# Patient Record
Sex: Female | Born: 1965 | Race: White | Hispanic: No | Marital: Single | Smoking: Current every day smoker
Health system: Southern US, Community
[De-identification: ages and names within clinical notes are randomized; demographics above are authoritative.]

## PROBLEM LIST (undated history)

## (undated) DIAGNOSIS — I809 Phlebitis and thrombophlebitis of unspecified site: Secondary | ICD-10-CM

## (undated) DIAGNOSIS — G8929 Other chronic pain: Secondary | ICD-10-CM

## (undated) DIAGNOSIS — R1012 Left upper quadrant pain: Secondary | ICD-10-CM

## (undated) DIAGNOSIS — O4590 Premature separation of placenta, unspecified, unspecified trimester: Secondary | ICD-10-CM

## (undated) DIAGNOSIS — K589 Irritable bowel syndrome without diarrhea: Secondary | ICD-10-CM

## (undated) DIAGNOSIS — M1990 Unspecified osteoarthritis, unspecified site: Secondary | ICD-10-CM

## (undated) DIAGNOSIS — L0591 Pilonidal cyst without abscess: Secondary | ICD-10-CM

## (undated) DIAGNOSIS — F32A Depression, unspecified: Secondary | ICD-10-CM

## (undated) DIAGNOSIS — F329 Major depressive disorder, single episode, unspecified: Secondary | ICD-10-CM

## (undated) DIAGNOSIS — M542 Cervicalgia: Secondary | ICD-10-CM

## (undated) DIAGNOSIS — E785 Hyperlipidemia, unspecified: Secondary | ICD-10-CM

## (undated) DIAGNOSIS — J189 Pneumonia, unspecified organism: Secondary | ICD-10-CM

## (undated) DIAGNOSIS — R21 Rash and other nonspecific skin eruption: Secondary | ICD-10-CM

## (undated) DIAGNOSIS — M549 Dorsalgia, unspecified: Secondary | ICD-10-CM

## (undated) DIAGNOSIS — N200 Calculus of kidney: Secondary | ICD-10-CM

## (undated) DIAGNOSIS — N281 Cyst of kidney, acquired: Secondary | ICD-10-CM

## (undated) DIAGNOSIS — Z8614 Personal history of Methicillin resistant Staphylococcus aureus infection: Secondary | ICD-10-CM

## (undated) DIAGNOSIS — K219 Gastro-esophageal reflux disease without esophagitis: Secondary | ICD-10-CM

## (undated) DIAGNOSIS — F419 Anxiety disorder, unspecified: Secondary | ICD-10-CM

## (undated) DIAGNOSIS — N809 Endometriosis, unspecified: Secondary | ICD-10-CM

## (undated) DIAGNOSIS — C539 Malignant neoplasm of cervix uteri, unspecified: Secondary | ICD-10-CM

## (undated) HISTORY — PX: ABDOMINAL HYSTERECTOMY: SHX81

## (undated) HISTORY — DX: Pilonidal cyst without abscess: L05.91

## (undated) HISTORY — DX: Phlebitis and thrombophlebitis of unspecified site: I80.9

## (undated) HISTORY — DX: Cyst of kidney, acquired: N28.1

## (undated) HISTORY — DX: Rash and other nonspecific skin eruption: R21

## (undated) HISTORY — PX: COLONOSCOPY: SHX174

## (undated) HISTORY — PX: BACK SURGERY: SHX140

## (undated) HISTORY — PX: TONSILLECTOMY: SUR1361

## (undated) HISTORY — DX: Premature separation of placenta, unspecified, unspecified trimester: O45.90

## (undated) HISTORY — DX: Gastro-esophageal reflux disease without esophagitis: K21.9

## (undated) HISTORY — DX: Endometriosis, unspecified: N80.9

## (undated) HISTORY — DX: Malignant neoplasm of cervix uteri, unspecified: C53.9

## (undated) HISTORY — DX: Calculus of kidney: N20.0

## (undated) HISTORY — DX: Hyperlipidemia, unspecified: E78.5

## (undated) HISTORY — DX: Left upper quadrant pain: R10.12

---

## 1987-09-29 DIAGNOSIS — C539 Malignant neoplasm of cervix uteri, unspecified: Secondary | ICD-10-CM

## 1987-09-29 HISTORY — DX: Malignant neoplasm of cervix uteri, unspecified: C53.9

## 2011-09-28 ENCOUNTER — Other Ambulatory Visit (HOSPITAL_COMMUNITY): Payer: Self-pay | Admitting: Family Medicine

## 2011-09-28 DIAGNOSIS — Z139 Encounter for screening, unspecified: Secondary | ICD-10-CM

## 2011-10-05 ENCOUNTER — Ambulatory Visit (HOSPITAL_COMMUNITY)
Admission: RE | Admit: 2011-10-05 | Discharge: 2011-10-05 | Disposition: A | Discharge status: Home / Self Care | Payer: BC Managed Care – PPO | Source: Ambulatory Visit | Attending: Family Medicine | Admitting: Family Medicine

## 2011-10-05 DIAGNOSIS — Z1231 Encounter for screening mammogram for malignant neoplasm of breast: Secondary | ICD-10-CM | POA: Insufficient documentation

## 2011-10-05 DIAGNOSIS — Z139 Encounter for screening, unspecified: Secondary | ICD-10-CM

## 2012-03-14 ENCOUNTER — Ambulatory Visit (HOSPITAL_COMMUNITY)
Admission: RE | Admit: 2012-03-14 | Discharge: 2012-03-14 | Disposition: A | Discharge status: Home / Self Care | Payer: BC Managed Care – PPO | Source: Ambulatory Visit | Attending: Internal Medicine | Admitting: Internal Medicine

## 2012-03-14 ENCOUNTER — Other Ambulatory Visit (HOSPITAL_COMMUNITY): Payer: Self-pay | Admitting: Internal Medicine

## 2012-03-14 DIAGNOSIS — Z01811 Encounter for preprocedural respiratory examination: Secondary | ICD-10-CM

## 2012-03-14 DIAGNOSIS — R059 Cough, unspecified: Secondary | ICD-10-CM | POA: Insufficient documentation

## 2012-03-14 DIAGNOSIS — Z01818 Encounter for other preprocedural examination: Secondary | ICD-10-CM | POA: Insufficient documentation

## 2012-03-14 DIAGNOSIS — R079 Chest pain, unspecified: Secondary | ICD-10-CM | POA: Insufficient documentation

## 2012-03-14 DIAGNOSIS — R05 Cough: Secondary | ICD-10-CM | POA: Insufficient documentation

## 2012-07-18 ENCOUNTER — Other Ambulatory Visit (HOSPITAL_COMMUNITY): Payer: Self-pay | Admitting: Internal Medicine

## 2012-07-18 DIAGNOSIS — M549 Dorsalgia, unspecified: Secondary | ICD-10-CM

## 2012-07-18 DIAGNOSIS — M545 Low back pain, unspecified: Secondary | ICD-10-CM

## 2012-07-20 ENCOUNTER — Ambulatory Visit (HOSPITAL_COMMUNITY): Payer: BC Managed Care – PPO

## 2012-08-02 ENCOUNTER — Ambulatory Visit (HOSPITAL_COMMUNITY)
Admission: RE | Admit: 2012-08-02 | Discharge: 2012-08-02 | Disposition: A | Discharge status: Home / Self Care | Payer: BC Managed Care – PPO | Source: Ambulatory Visit | Attending: Internal Medicine | Admitting: Internal Medicine

## 2012-08-02 DIAGNOSIS — M545 Low back pain, unspecified: Secondary | ICD-10-CM | POA: Insufficient documentation

## 2012-08-02 DIAGNOSIS — M5137 Other intervertebral disc degeneration, lumbosacral region: Secondary | ICD-10-CM | POA: Insufficient documentation

## 2012-08-02 DIAGNOSIS — M5126 Other intervertebral disc displacement, lumbar region: Secondary | ICD-10-CM | POA: Insufficient documentation

## 2012-08-02 DIAGNOSIS — M549 Dorsalgia, unspecified: Secondary | ICD-10-CM

## 2012-08-02 DIAGNOSIS — M51379 Other intervertebral disc degeneration, lumbosacral region without mention of lumbar back pain or lower extremity pain: Secondary | ICD-10-CM | POA: Insufficient documentation

## 2012-09-28 DIAGNOSIS — N200 Calculus of kidney: Secondary | ICD-10-CM

## 2012-09-28 DIAGNOSIS — N281 Cyst of kidney, acquired: Secondary | ICD-10-CM

## 2012-09-28 DIAGNOSIS — Z8614 Personal history of Methicillin resistant Staphylococcus aureus infection: Secondary | ICD-10-CM

## 2012-09-28 HISTORY — DX: Personal history of Methicillin resistant Staphylococcus aureus infection: Z86.14

## 2012-09-28 HISTORY — DX: Cyst of kidney, acquired: N28.1

## 2012-09-28 HISTORY — DX: Calculus of kidney: N20.0

## 2012-09-28 HISTORY — PX: COCCYX REMOVAL: SHX600

## 2013-09-28 DIAGNOSIS — L0591 Pilonidal cyst without abscess: Secondary | ICD-10-CM

## 2013-09-28 HISTORY — DX: Pilonidal cyst without abscess: L05.91

## 2014-11-15 ENCOUNTER — Encounter (HOSPITAL_COMMUNITY): Payer: Self-pay

## 2014-11-15 ENCOUNTER — Emergency Department (HOSPITAL_COMMUNITY)
Admission: EM | Admit: 2014-11-15 | Discharge: 2014-11-15 | Disposition: A | Discharge status: Home / Self Care | Payer: Self-pay | Attending: Emergency Medicine | Admitting: Emergency Medicine

## 2014-11-15 DIAGNOSIS — M545 Low back pain: Secondary | ICD-10-CM | POA: Insufficient documentation

## 2014-11-15 DIAGNOSIS — Z720 Tobacco use: Secondary | ICD-10-CM | POA: Insufficient documentation

## 2014-11-15 DIAGNOSIS — G8918 Other acute postprocedural pain: Secondary | ICD-10-CM | POA: Insufficient documentation

## 2014-11-15 DIAGNOSIS — G8929 Other chronic pain: Secondary | ICD-10-CM | POA: Insufficient documentation

## 2014-11-15 DIAGNOSIS — K5900 Constipation, unspecified: Secondary | ICD-10-CM | POA: Insufficient documentation

## 2014-11-15 DIAGNOSIS — Z8614 Personal history of Methicillin resistant Staphylococcus aureus infection: Secondary | ICD-10-CM | POA: Insufficient documentation

## 2014-11-15 DIAGNOSIS — Z3202 Encounter for pregnancy test, result negative: Secondary | ICD-10-CM | POA: Insufficient documentation

## 2014-11-15 DIAGNOSIS — Z9889 Other specified postprocedural states: Secondary | ICD-10-CM | POA: Insufficient documentation

## 2014-11-15 HISTORY — DX: Irritable bowel syndrome, unspecified: K58.9

## 2014-11-15 HISTORY — DX: Dorsalgia, unspecified: M54.9

## 2014-11-15 HISTORY — DX: Other chronic pain: G89.29

## 2014-11-15 LAB — CBC WITH DIFFERENTIAL/PLATELET
Basophils Absolute: 0.1 10*3/uL (ref 0.0–0.1)
Basophils Relative: 1 % (ref 0–1)
Eosinophils Absolute: 0.1 10*3/uL (ref 0.0–0.7)
Eosinophils Relative: 1 % (ref 0–5)
HCT: 42.7 % (ref 36.0–46.0)
HEMOGLOBIN: 13.9 g/dL (ref 12.0–15.0)
Lymphocytes Relative: 35 % (ref 12–46)
Lymphs Abs: 3.3 10*3/uL (ref 0.7–4.0)
MCH: 30.2 pg (ref 26.0–34.0)
MCHC: 32.6 g/dL (ref 30.0–36.0)
MCV: 92.6 fL (ref 78.0–100.0)
Monocytes Absolute: 0.4 10*3/uL (ref 0.1–1.0)
Monocytes Relative: 4 % (ref 3–12)
Neutro Abs: 5.5 10*3/uL (ref 1.7–7.7)
Neutrophils Relative %: 59 % (ref 43–77)
Platelets: 252 10*3/uL (ref 150–400)
RBC: 4.61 MIL/uL (ref 3.87–5.11)
RDW: 12.4 % (ref 11.5–15.5)
WBC: 9.3 10*3/uL (ref 4.0–10.5)

## 2014-11-15 LAB — URINALYSIS, ROUTINE W REFLEX MICROSCOPIC
BILIRUBIN URINE: NEGATIVE
GLUCOSE, UA: NEGATIVE mg/dL
HGB URINE DIPSTICK: NEGATIVE
KETONES UR: NEGATIVE mg/dL
Nitrite: NEGATIVE
PH: 6 (ref 5.0–8.0)
PROTEIN: NEGATIVE mg/dL
Specific Gravity, Urine: 1.01 (ref 1.005–1.030)
Urobilinogen, UA: 0.2 mg/dL (ref 0.0–1.0)

## 2014-11-15 LAB — BASIC METABOLIC PANEL
Anion gap: 4 — ABNORMAL LOW (ref 5–15)
BUN: 11 mg/dL (ref 6–23)
CO2: 28 mmol/L (ref 19–32)
Calcium: 9.2 mg/dL (ref 8.4–10.5)
Chloride: 107 mmol/L (ref 96–112)
Creatinine, Ser: 0.68 mg/dL (ref 0.50–1.10)
GFR calc Af Amer: >90 mL/min (ref >90)
GFR calc non Af Amer: >90 mL/min (ref >90)
Glucose, Bld: 78 mg/dL (ref 70–99)
Potassium: 4.1 mmol/L (ref 3.5–5.1)
SODIUM: 139 mmol/L (ref 135–145)

## 2014-11-15 LAB — LACTIC ACID, PLASMA: Lactic Acid, Venous: 0.7 mmol/L (ref 0.5–2.0)

## 2014-11-15 LAB — URINE MICROSCOPIC-ADD ON

## 2014-11-15 LAB — PREGNANCY, URINE: PREG TEST UR: NEGATIVE

## 2014-11-15 NOTE — ED Notes (Signed)
Pt reports had her coccyx removed in 2014.  Reports since then has had several problems with incision infections and wounds.  Pt says incision is very swollen and painful at this time.  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

## 2014-11-15 NOTE — Discharge Instructions (Signed)
SEEK IMMEDIATE MEDICAL ATTENTION IF: New  weakness, or problem with the use of your arms or legs.  Severe back pain not relieved with medications.  Change in bowel or bladder control (if you lose control of stool or urine, or if you are unable to urinate) Increasing pain in any areas of the body (such as chest or abdominal pain).  Shortness of breath, dizziness or fainting.  Nausea (feeling sick to your stomach), vomiting, fever, or sweats.

## 2014-11-15 NOTE — ED Provider Notes (Signed)
CSN: [id number]     Arrival date & time 11/15/14  1255 History   First MD Initiated Contact with Patient 11/15/14 1720     Chief Complaint  Patient presents with  . Wound Infection     Patient is a 49 y.o. female presenting with back pain. The history is provided by the patient.  Back Pain Pain location: coccyx. Quality:  Aching Pain severity:  Moderate Onset quality:  Gradual Duration: 2 yrs. Timing:  Intermittent Progression:  Worsening Chronicity:  Recurrent Relieved by:  Nothing Worsened by:  Movement Associated symptoms: no fever and no weakness   Patient reports she had her coccyx removed in 2014 in [place] She had post operative complications including MRSA infection She reports she has had pain in that region frequently since surgery She reports over past 7 months she has noticed increased swelling She reports worsened pain/swelling over past several days   Past Medical History  Diagnosis Date  . IBS (irritable bowel syndrome)   . Chronic back pain    Past Surgical History  Procedure Laterality Date  . Coccyx removal  2014   No family history on file. History  Substance Use Topics  . Smoking status: Current Every Day Smoker  . Smokeless tobacco: Not on file  . Alcohol Use: No   OB History    No data available     Review of Systems  Constitutional: Negative for fever.  Gastrointestinal: Positive for constipation. Negative for vomiting.  Musculoskeletal: Positive for back pain.  Neurological: Negative for weakness.  All other systems reviewed and are negative.     Allergies  Review of patient's allergies indicates no known allergies.  Home Medications   Prior to Admission medications   Medication Sig Start Date End Date Taking? Authorizing Provider  HYDROcodone-acetaminophen (NORCO) 10-325 MG per tablet Take 0.5-1 tablets by mouth every 4 (four) hours as needed (pain).   Yes Historical Provider, MD   BP 126/76 mmHg  Pulse 62  Temp(Src) 98.4  F (36.9 C) (Oral)  Resp 20  Ht 5\' 3"  (1.6 m)  Wt 148 lb (67.132 kg)  BMI 26.22 kg/m2  SpO2 100% Physical Exam CONSTITUTIONAL: Well developed/well nourished HEAD: Normocephalic/atraumatic EYES: EOMI/PERRL ENMT: Mucous membranes moist NECK: supple no meningeal signs SPINE/BACK:entire spine nontender  Gluteal cleft - well healed scar noted.  No erythema/drainage/crepitus/induration.  Mild tenderness to palpation noted CV: S1/S2 noted, no murmurs/rubs/gallops noted LUNGS: Lungs are clear to auscultation bilaterally, no apparent distress ABDOMEN: soft, nontender, no rebound or guarding GU:no cva tenderness NEURO: Awake/alert, equal motor 5/5 strength noted with the following: hip flexion/knee flexion/extension, foot dorsi/plantar flexion, great toe extension intact bilaterally, Pt is able to ambulate unassisted. EXTREMITIES: pulses normal, full ROM SKIN: warm, color normal PSYCH: no abnormalities of mood noted, alert and oriented to situation   ED Course  Procedures  6:46 PM Pt declines pain meds Labs pending Well healed scar noted  Pt appropriate for outpatient management as this has been ongoing for months   Labs Review Labs Reviewed  BASIC METABOLIC PANEL - Abnormal; Notable for the following:    Anion gap 4 (*)    All other components within normal limits  URINALYSIS, ROUTINE W REFLEX MICROSCOPIC - Abnormal; Notable for the following:    Leukocytes, UA TRACE (*)    All other components within normal limits  CBC WITH DIFFERENTIAL/PLATELET  LACTIC ACID, PLASMA  PREGNANCY, URINE  URINE MICROSCOPIC-ADD ON      MDM   Final diagnoses:  Post-operative  pain    Nursing notes including past medical history and social history reviewed and considered in documentation Labs/vital reviewed myself and considered during evaluation Narcotic database reviewed and considered in decision making     Sharyon Cable, MD 11/15/14 2152

## 2014-11-15 NOTE — ED Notes (Signed)
MD at bedside. 

## 2014-11-16 ENCOUNTER — Emergency Department (HOSPITAL_COMMUNITY)
Admission: EM | Admit: 2014-11-16 | Discharge: 2014-11-16 | Disposition: A | Discharge status: Home / Self Care | Payer: Self-pay | Attending: Emergency Medicine | Admitting: Emergency Medicine

## 2014-11-16 ENCOUNTER — Emergency Department (HOSPITAL_COMMUNITY): Payer: Self-pay

## 2014-11-16 ENCOUNTER — Encounter (HOSPITAL_COMMUNITY): Payer: Self-pay | Admitting: *Deleted

## 2014-11-16 DIAGNOSIS — Z8719 Personal history of other diseases of the digestive system: Secondary | ICD-10-CM | POA: Insufficient documentation

## 2014-11-16 DIAGNOSIS — M533 Sacrococcygeal disorders, not elsewhere classified: Secondary | ICD-10-CM | POA: Insufficient documentation

## 2014-11-16 DIAGNOSIS — Z79899 Other long term (current) drug therapy: Secondary | ICD-10-CM | POA: Insufficient documentation

## 2014-11-16 DIAGNOSIS — Z9089 Acquired absence of other organs: Secondary | ICD-10-CM | POA: Insufficient documentation

## 2014-11-16 DIAGNOSIS — G8929 Other chronic pain: Secondary | ICD-10-CM | POA: Insufficient documentation

## 2014-11-16 DIAGNOSIS — Z720 Tobacco use: Secondary | ICD-10-CM | POA: Insufficient documentation

## 2014-11-16 MED ORDER — MORPHINE SULFATE 4 MG/ML IJ SOLN
4.0000 mg | Freq: Once | INTRAMUSCULAR | Status: AC
  Administered 2014-11-16: 4 mg via INTRAVENOUS
  Filled 2014-11-16: qty 1

## 2014-11-16 MED ORDER — OXYCODONE-ACETAMINOPHEN 5-325 MG PO TABS: 1.0000 | ORAL_TABLET | ORAL | Status: DC | PRN

## 2014-11-16 MED ORDER — IOHEXOL 300 MG/ML  SOLN
100.0000 mL | Freq: Once | INTRAMUSCULAR | Status: AC | PRN
  Administered 2014-11-16: 100 mL via INTRAVENOUS

## 2014-11-16 MED ORDER — PREDNISONE 10 MG PO TABS: ORAL_TABLET | ORAL | Status: DC

## 2014-11-16 MED ORDER — ONDANSETRON HCL 4 MG/2ML IJ SOLN
4.0000 mg | Freq: Once | INTRAMUSCULAR | Status: AC
  Administered 2014-11-16: 4 mg via INTRAVENOUS

## 2014-11-16 MED ORDER — ONDANSETRON HCL 4 MG/2ML IJ SOLN
4.0000 mg | Freq: Once | INTRAMUSCULAR | Status: DC
  Filled 2014-11-16: qty 2

## 2014-11-16 NOTE — ED Notes (Signed)
Seen here yesterday for back pain, Continues to hurt.  "my leg goes out". Alert, talking.

## 2014-11-16 NOTE — ED Notes (Signed)
Patient with no complaints at this time. Respirations even and unlabored. Skin warm/dry. Discharge instructions reviewed with patient at this time. Patient given opportunity to voice concerns/ask questions. IV removed per policy and band-aid applied to site. Patient discharged at this time and left Emergency Department with steady gait.  

## 2014-11-16 NOTE — ED Notes (Signed)
PA at bedside.

## 2014-11-16 NOTE — Discharge Instructions (Signed)
Musculoskeletal Pain Musculoskeletal pain is muscle and boney aches and pains. These pains can occur in any part of the body. Your caregiver may treat you without knowing the cause of the pain. They may treat you if blood or urine tests, X-rays, and other tests were normal.  CAUSES There is often not a definite cause or reason for these pains. These pains may be caused by a type of germ (virus). The discomfort may also come from overuse. Overuse includes working out too hard when your body is not fit. Boney aches also come from weather changes. Bone is sensitive to atmospheric pressure changes. HOME CARE INSTRUCTIONS   Ask when your test results will be ready. Make sure you get your test results.  Only take over-the-counter or prescription medicines for pain, discomfort, or fever as directed by your caregiver. If you were given medications for your condition, do not drive, operate machinery or power tools, or sign legal documents for 24 hours. Do not drink alcohol. Do not take sleeping pills or other medications that may interfere with treatment.  Continue all activities unless the activities cause more pain. When the pain lessens, slowly resume normal activities. Gradually increase the intensity and duration of the activities or exercise.  During periods of severe pain, bed rest may be helpful. Lay or sit in any position that is comfortable.  Putting ice on the injured area.  Put ice in a bag.  Place a towel between your skin and the bag.  Leave the ice on for 15 to 20 minutes, 3 to 4 times a day.  Follow up with your caregiver for continued problems and no reason can be found for the pain. If the pain becomes worse or does not go away, it may be necessary to repeat tests or do additional testing. Your caregiver may need to look further for a possible cause. SEEK IMMEDIATE MEDICAL CARE IF:  You have pain that is getting worse and is not relieved by medications.  You develop chest pain  that is associated with shortness or breath, sweating, feeling sick to your stomach (nauseous), or throw up (vomit).  Your pain becomes localized to the abdomen.  You develop any new symptoms that seem different or that concern you. MAKE SURE YOU:   Understand these instructions.  Will watch your condition.  Will get help right away if you are not doing well or get worse. Document Released: 09/14/2005 Document Revised: 12/07/2011 Document Reviewed: 05/19/2013 [facility] Patient Information 2015 [place], [place]. This information is not intended to replace advice given to you by your health care provider. Make sure you discuss any questions you have with your health care provider.   Use the medicines prescribed as discussed.  You may take the oxycodone prescribed for pain relief.  This will make you drowsy - do not drive within 4 hours of taking this medication.

## 2014-11-16 NOTE — ED Notes (Signed)
Patient has not had f/u MRI of lower back since coccyx removal in 2014.

## 2014-11-18 NOTE — ED Provider Notes (Signed)
CSN: [id number]     Arrival date & time 11/16/14  1159 History   First MD Initiated Contact with Patient 11/16/14 1253     Chief Complaint  Patient presents with  . Back Pain     (Consider location/radiation/quality/duration/timing/severity/associated sxs/prior Treatment) The history is provided by the patient.   Toni Chan is a 49 y.o. female with a prior history of lumbar fusion and subsequent sacral and subsequent coccyx removal due to complicating post surgical mrsa infection and also describes her sacrum was "shattered" while undergoing a dye procedure (mylogram?) while living in [place] 2 years ago.  She presents today with severe pain in her coccyx/sacral area and endorses swelling at her midline upper buttocks which is worsening, exquisitely tender with sitting and even light palpation, but is constant sharp pain even when standing or not applying pressure.  She denies fevers or chills, denies drainage or redness at the site.  She reports chronic pain at this site, but worsened this week, and even more painful since yesterday since she was seen here for this same complaint.    Past Medical History  Diagnosis Date  . IBS (irritable bowel syndrome)   . Chronic back pain    Past Surgical History  Procedure Laterality Date  . Coccyx removal  2014   History reviewed. No pertinent family history. History  Substance Use Topics  . Smoking status: Current Every Day Smoker  . Smokeless tobacco: Not on file  . Alcohol Use: No   OB History    No data available     Review of Systems  Constitutional: Negative for fever and chills.  Gastrointestinal: Negative for nausea, vomiting, abdominal pain and constipation.  Genitourinary: Negative for dysuria, urgency, frequency, flank pain and difficulty urinating.  Musculoskeletal: Positive for back pain. Negative for gait problem.  Skin: Negative for color change, rash and wound.  Neurological: Negative for weakness and numbness.       Allergies  Review of patient's allergies indicates no known allergies.  Home Medications   Prior to Admission medications   Medication Sig Start Date End Date Taking? Authorizing Provider  citalopram (CELEXA) 40 MG tablet Take 40 mg by mouth daily.   Yes Historical Provider, MD  HYDROcodone-acetaminophen (NORCO) 10-325 MG per tablet Take 0.5-1 tablets by mouth every 4 (four) hours as needed (pain).   Yes Historical Provider, MD  oxyCODONE-acetaminophen (PERCOCET/ROXICET) 5-325 MG per tablet Take 1 tablet by mouth every 4 (four) hours as needed. 11/16/14   Evalee Jefferson, PA-C  predniSONE (DELTASONE) 10 MG tablet 6, 5, 4, 3, 2 then 1 tablet by mouth daily for 6 days total. 11/16/14   Evalee Jefferson, PA-C   BP 128/74 mmHg  Pulse 78  Temp(Src) 98 F (36.7 C) (Oral)  Resp 15  Ht 5\' 3"  (1.6 m)  Wt 140 lb (63.504 kg)  BMI 24.81 kg/m2  SpO2 100% Physical Exam  Constitutional: She appears well-developed and well-nourished.  HENT:  Head: Normocephalic.  Eyes: Conjunctivae are normal.  Neck: Normal range of motion. Neck supple.  Cardiovascular: Normal rate and intact distal pulses.   Pedal pulses normal.  Pulmonary/Chest: Effort normal.  Abdominal: Soft. Bowel sounds are normal. She exhibits no distension and no mass.  Musculoskeletal: Normal range of motion. She exhibits tenderness. She exhibits no edema.       Lumbar back: She exhibits tenderness. She exhibits no swelling, no edema and no spasm.  Exquisite ttp across sacrum and surgical incision which appears well healed.  No erythema  or lesions.  There is a fullness at her sacrum/coccyx which appears to be anatomical with increased adipose tissue rather than infection/abscess.    Neurological: She is alert. She has normal strength. She displays no atrophy and no tremor. No sensory deficit. Gait normal.  Reflex Scores:      Patellar reflexes are 2+ on the right side and 2+ on the left side.      Achilles reflexes are 2+ on the right side  and 2+ on the left side. No strength deficit noted in hip and knee flexor and extensor muscle groups.  Ankle flexion and extension intact.  Skin: Skin is warm and dry.  Psychiatric: She has a normal mood and affect.  Nursing note and vitals reviewed.   ED Course  Procedures (including critical care time) Labs Review Labs Reviewed - No data to display  Imaging Review Ct Pelvis W Contrast  11/16/2014   CLINICAL DATA:  Pain and swelling around spinal incision.  EXAM: CT PELVIS WITH CONTRAST  TECHNIQUE: Multidetector CT imaging of the pelvis was performed using the standard protocol following the bolus administration of intravenous contrast.  CONTRAST:  13mL OMNIPAQUE IOHEXOL 300 MG/ML  SOLN  COMPARISON:  08/02/2014  FINDINGS: /Urinary Tract: The urinary bladder appears normal.  Bowel: The pelvic bowel loops are within normal limits.  Vascular/Lymphatic: The abdominal aorta and its branches appear normal. There is no adenopathy.  Reproductive: Previous hysterectomy.  No adnexal mass noted.  Other: No free fluid or fluid collections identified within the lower abdomen or pelvis. No soft tissue abscess identified overlying the lower back and sacrum.  Musculoskeletal: The patient is status post hardware fixation of the L5 and S1 vertebra.  IMPRESSION: 1. No abscess identified.   Electronically Signed   By: Kerby Moors M.D.   On: 11/16/2014 16:21     EKG Interpretation None      MDM   Final diagnoses:  Coccydynia    Discussed with Dr. Vanita Panda who agrees with CT imaging given pain out of proportion to findings with no obvious source.  CT negative for abscess or other visible source of pain.  She was prescribed oxycodone and prednisone for inflammation.  Advised f/u with pcp for further management of ongong symptoms.    Evalee Jefferson, PA-C 11/18/14 1115  Carmin Muskrat, MD 11/18/14 [phone]

## 2014-12-03 ENCOUNTER — Encounter: Payer: Self-pay | Admitting: Family Medicine

## 2014-12-03 ENCOUNTER — Ambulatory Visit (INDEPENDENT_AMBULATORY_CARE_PROVIDER_SITE_OTHER): Payer: Self-pay | Admitting: Family Medicine

## 2014-12-03 VITALS — BP 127/84 | HR 68 | Temp 98.6°F | Ht 63.0 in | Wt 157.5 lb

## 2014-12-03 DIAGNOSIS — K219 Gastro-esophageal reflux disease without esophagitis: Secondary | ICD-10-CM

## 2014-12-03 DIAGNOSIS — M5136 Other intervertebral disc degeneration, lumbar region: Secondary | ICD-10-CM | POA: Insufficient documentation

## 2014-12-03 DIAGNOSIS — Z0000 Encounter for general adult medical examination without abnormal findings: Secondary | ICD-10-CM

## 2014-12-03 DIAGNOSIS — N809 Endometriosis, unspecified: Secondary | ICD-10-CM | POA: Insufficient documentation

## 2014-12-03 DIAGNOSIS — F329 Major depressive disorder, single episode, unspecified: Secondary | ICD-10-CM

## 2014-12-03 DIAGNOSIS — F32A Depression, unspecified: Secondary | ICD-10-CM | POA: Insufficient documentation

## 2014-12-03 DIAGNOSIS — F419 Anxiety disorder, unspecified: Secondary | ICD-10-CM

## 2014-12-03 DIAGNOSIS — M5441 Lumbago with sciatica, right side: Secondary | ICD-10-CM

## 2014-12-03 LAB — COMPREHENSIVE METABOLIC PANEL
ALBUMIN: 4.4 g/dL (ref 3.5–5.2)
ALT: 16 U/L (ref 0–35)
AST: 16 U/L (ref 0–37)
Alkaline Phosphatase: 64 U/L (ref 39–117)
BILIRUBIN TOTAL: 0.3 mg/dL (ref 0.2–1.2)
BUN: 9 mg/dL (ref 6–23)
CO2: 27 meq/L (ref 19–32)
Calcium: 9.4 mg/dL (ref 8.4–10.5)
Chloride: 106 mEq/L (ref 96–112)
Creat: 0.73 mg/dL (ref 0.50–1.10)
Glucose, Bld: 78 mg/dL (ref 70–99)
Potassium: 4.5 mEq/L (ref 3.5–5.3)
SODIUM: 141 meq/L (ref 135–145)
Total Protein: 6.8 g/dL (ref 6.0–8.3)

## 2014-12-03 LAB — LIPID PANEL
CHOLESTEROL: 208 mg/dL — AB (ref 0–200)
HDL: 45 mg/dL — ABNORMAL LOW (ref >=46)
LDL Cholesterol: 125 mg/dL — ABNORMAL HIGH (ref 0–99)
TRIGLYCERIDES: 192 mg/dL — AB (ref <150)
Total CHOL/HDL Ratio: 4.6 Ratio
VLDL: 38 mg/dL (ref 0–40)

## 2014-12-03 LAB — TSH: TSH: 0.686 u[IU]/mL (ref 0.350–4.500)

## 2014-12-03 MED ORDER — OMEPRAZOLE 20 MG PO CPDR: 20.0000 mg | DELAYED_RELEASE_CAPSULE | Freq: Every day | ORAL | Status: DC

## 2014-12-03 NOTE — Progress Notes (Signed)
   Subjective:    Patient ID: Toni Chan, female    DOB: 12-22-1965, 49 y.o.   MRN: [id number]  HPI 49 y/o female presents to establish care.   Reviewed PMH/Surgical History/Meds/Allergies/Family History/Social History and updated in Epic. Please see scanned copy of New Patient Health History.   Previous Health Maintenance: Colonoscopy 2014 Mammogram 2009 Pap Smear 1993 (s/p hysterectomy) DEXA 2002 PNA vaccine 2010 Stress Test 2013  Chronic Low back pain - patient reports chronic low back pain, has had 3 separate back surgeries in 2013 and 2014, brought records of surgeries (see review in overview section). Reports chronic burning sensation with radiation down right LE, no associated numbness or weakness in lower extremities but reports occasional giving out of right leg. Taking Norco and Percocet prn pain which provides minimal relief of symptoms, she would like to be off these medications, has previously been seen by Neurology and was on a number of medications including Gabapentin, this did not provide relief of symptoms. Seen in ED in February with complaints of increased pain and swelling in previous surgical area. CT negative for abscess or fluid collection. Sent home with narcotic pain medications. Patient reports that swelling has persisted, no drainage, no warmth or erythema, no fevers/chills. No bladder or bowel incontinence.   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 No current SI.  GERD/IBS - needs refill of omeprazole, no nausea/emesis    Review of Systems  Constitutional: Negative for fever and fatigue.  Respiratory: Negative for cough, shortness of breath and stridor.   Cardiovascular: Negative for chest pain.  Gastrointestinal: Negative for nausea, vomiting and diarrhea.  Musculoskeletal: Positive for back pain.  Skin: Negative for  rash and wound.  Psychiatric/Behavioral: Positive for sleep disturbance and decreased concentration.       Objective:   Physical Exam Vitals: reviewed Gen: pleasant female, NAD HEENT: normocephalic, PERRL, EOMI, no scleral icterus, nasal septum midline, no rhinorrhea, MMM, uvula midline, neck supple, no cervical adenopathy, no thyromegaly, bilateral TM's pearly grey Cardiac: RRR, S1 and S2, no murmur, no heaves/thrills Resp: CTAB, normal effort Abd: soft, no tenderness, normal bowel sounds, no organomegaly, no rebound, no guarding Ext: no edema, 2+ radial and DP pulses MSK: bilateral paraspinal and midline lumbar/sacral/coccygeal tenderness Skin: scars from previous back surgeries well healed Neuro: A&O x3, strength 5/5 grossly in all extremities, sensation to light touch grossly intact in all extremities, no clonus, 1+ patellar reflexes bilaterally  Reviewed ED notes and Imaging in EPIC.      Assessment & Plan:  Please see problem specific assessment and plan.

## 2014-12-03 NOTE — Assessment & Plan Note (Signed)
Stable with Omeprazole -refill for Omeprazole 20 mg sent to pharmacy

## 2014-12-03 NOTE — Patient Instructions (Signed)
It was nice to meet you today.   Dr. Ree Kida has ordered some lab work. He will call you with the results. Will send for records.   Once Dr. Ree Kida has reviewed your old records we will discuss a plan of action.   A refill of your acid reflux medication has been sent to the pharmacy.

## 2014-12-03 NOTE — Assessment & Plan Note (Signed)
Transition Celexa to Cymbalta for better pain control. -Decrease Cymbalta to 20 mg for one week -After one week start Cymbalta 30 mg daily and stop Celexa

## 2014-12-03 NOTE — Assessment & Plan Note (Addendum)
Patient presents for establishment of care. She has undergone multiple lumbosacral surgeries as outlined in the overview section. Records were reviewed after the patient was seen in office. Symptoms are consistent with radicular pain and neuropathic pain from multiple surgeries. No red flag symptoms to suggest acute spinal impingement.  -recommend trial of Lyrica  -Would also switch Celexa to Cymbalta to control both depressive and neuropathic symptoms. -Referral will be placed to orthopedic surgery.

## 2014-12-04 ENCOUNTER — Encounter: Payer: Self-pay | Admitting: Family Medicine

## 2014-12-04 ENCOUNTER — Telehealth: Payer: Self-pay | Admitting: Family Medicine

## 2014-12-04 DIAGNOSIS — E7800 Pure hypercholesterolemia, unspecified: Secondary | ICD-10-CM

## 2014-12-04 MED ORDER — DULOXETINE HCL 30 MG PO CPEP: 30.0000 mg | ORAL_CAPSULE | Freq: Every day | ORAL | Status: DC

## 2014-12-04 MED ORDER — PREGABALIN 75 MG PO CAPS: 75.0000 mg | ORAL_CAPSULE | Freq: Two times a day (BID) | ORAL | Status: DC

## 2014-12-04 NOTE — Telephone Encounter (Signed)
Discussed lab results with patient. Cholesterol mildly elevated. Discussed lifestyle modifications.

## 2014-12-04 NOTE — Assessment & Plan Note (Signed)
Total cholesterol mildly elevated to 208. ASCVD 1.2%. Discussed lifestyle modifications.

## 2014-12-05 LAB — DRUG SCR UR, PAIN MGMT, REFLEX CONF
AMPHETAMINE SCRN UR: NEGATIVE
BARBITURATE QUANT UR: NEGATIVE
BENZODIAZEPINES.: NEGATIVE
COCAINE METABOLITES: NEGATIVE
Creatinine,U: 20.85 mg/dL
METHADONE: NEGATIVE
Marijuana Metabolite: NEGATIVE
PHENCYCLIDINE (PCP): NEGATIVE
Propoxyphene: NEGATIVE

## 2014-12-06 ENCOUNTER — Telehealth: Payer: Self-pay | Admitting: Family Medicine

## 2014-12-06 NOTE — Telephone Encounter (Signed)
Form placed in PCP box 

## 2014-12-06 NOTE — Telephone Encounter (Signed)
Patient dropped "Patient Assistance Program" Form to be completed and signed by PCP. Please, fax approved forms ASAP. Also follow up with Patient.

## 2014-12-07 ENCOUNTER — Encounter: Payer: Self-pay | Admitting: Family Medicine

## 2014-12-07 ENCOUNTER — Other Ambulatory Visit: Payer: Self-pay | Admitting: Family Medicine

## 2014-12-07 DIAGNOSIS — M5441 Lumbago with sciatica, right side: Secondary | ICD-10-CM

## 2014-12-07 LAB — OPIATES/OPIOIDS (LC/MS-MS)
Codeine Urine: NEGATIVE ng/mL (ref <50)
HYDROCODONE: 286 ng/mL — AB (ref <50)
Hydromorphone: NEGATIVE ng/mL (ref <50)
MORPHINE: NEGATIVE ng/mL (ref <50)
NOROXYCODONE, UR: NEGATIVE ng/mL (ref <50)
Norhydrocodone, Ur: 405 ng/mL — ABNORMAL HIGH (ref <50)
Oxycodone, ur: NEGATIVE ng/mL (ref <50)
Oxymorphone: NEGATIVE ng/mL (ref <50)

## 2014-12-07 MED ORDER — PREGABALIN 75 MG PO CAPS: 75.0000 mg | ORAL_CAPSULE | Freq: Two times a day (BID) | ORAL | Status: DC

## 2014-12-07 NOTE — Telephone Encounter (Signed)
Patient plans to apply for assistance from [employer] to pay for Lyrica. Printed prescription for 90 day supply.

## 2014-12-07 NOTE — Telephone Encounter (Signed)
Form completed and faxed to [employer]. Copy placed in scan box. Originals placed in Dr. Nedra Hai box.

## 2014-12-11 ENCOUNTER — Telehealth: Payer: Self-pay | Admitting: Family Medicine

## 2014-12-11 NOTE — Telephone Encounter (Signed)
Spoke with pt she took an enema 1 hr ago, when bowels started moving she had bright red blood coming out. Pt was feeling weak but she said she felt that way before she even did it. Preceptor (dr Elease Hashimoto) said that she should give it a day, if she is still bleeding come in to be seen. If she starts getting dizzy and have shortness of breath she should go to ER. Pt informed. Will notify PCP. Deseree Kennon Holter, CMA

## 2014-12-11 NOTE — Telephone Encounter (Signed)
I have discussed this with the RN and agree with the plan.

## 2014-12-11 NOTE — Telephone Encounter (Signed)
Pt called to speak to the nurse for Dr. Ree Kida. She took a nighttime enema and used the bathroom but now has bright red blood when using the bathroom. Please call to discuss. jw

## 2014-12-17 ENCOUNTER — Telehealth: Payer: Self-pay | Admitting: Family Medicine

## 2014-12-17 NOTE — Telephone Encounter (Signed)
Pt called because she is not getting relief with the medication the doctor ordered. She also has some new symptoms. She would like to talk to a nurse for Dr. Ree Kida. jw

## 2014-12-17 NOTE — Telephone Encounter (Signed)
Spoke with pt and she says that the cymbalta is not working. She is currently waiting on her lyrica to come in, she has to wait a couple weeks since she cant afford it. Wants to know what she should do in the meantime. Deseree Kennon Holter, CMA

## 2014-12-20 ENCOUNTER — Emergency Department (HOSPITAL_COMMUNITY): Payer: Self-pay

## 2014-12-20 ENCOUNTER — Emergency Department (HOSPITAL_COMMUNITY)
Admission: EM | Admit: 2014-12-20 | Discharge: 2014-12-20 | Disposition: A | Discharge status: Home / Self Care | Payer: Self-pay | Attending: Emergency Medicine | Admitting: Emergency Medicine

## 2014-12-20 ENCOUNTER — Encounter (HOSPITAL_COMMUNITY): Payer: Self-pay | Admitting: Emergency Medicine

## 2014-12-20 DIAGNOSIS — M546 Pain in thoracic spine: Secondary | ICD-10-CM | POA: Insufficient documentation

## 2014-12-20 DIAGNOSIS — M791 Myalgia: Secondary | ICD-10-CM | POA: Insufficient documentation

## 2014-12-20 DIAGNOSIS — Z8679 Personal history of other diseases of the circulatory system: Secondary | ICD-10-CM | POA: Insufficient documentation

## 2014-12-20 DIAGNOSIS — Z981 Arthrodesis status: Secondary | ICD-10-CM | POA: Insufficient documentation

## 2014-12-20 DIAGNOSIS — Z8742 Personal history of other diseases of the female genital tract: Secondary | ICD-10-CM | POA: Insufficient documentation

## 2014-12-20 DIAGNOSIS — M5431 Sciatica, right side: Secondary | ICD-10-CM | POA: Insufficient documentation

## 2014-12-20 DIAGNOSIS — Z791 Long term (current) use of non-steroidal anti-inflammatories (NSAID): Secondary | ICD-10-CM | POA: Insufficient documentation

## 2014-12-20 DIAGNOSIS — M542 Cervicalgia: Secondary | ICD-10-CM | POA: Insufficient documentation

## 2014-12-20 DIAGNOSIS — G8929 Other chronic pain: Secondary | ICD-10-CM | POA: Insufficient documentation

## 2014-12-20 DIAGNOSIS — R42 Dizziness and giddiness: Secondary | ICD-10-CM | POA: Insufficient documentation

## 2014-12-20 DIAGNOSIS — Z79899 Other long term (current) drug therapy: Secondary | ICD-10-CM | POA: Insufficient documentation

## 2014-12-20 DIAGNOSIS — K219 Gastro-esophageal reflux disease without esophagitis: Secondary | ICD-10-CM | POA: Insufficient documentation

## 2014-12-20 DIAGNOSIS — R51 Headache: Secondary | ICD-10-CM | POA: Insufficient documentation

## 2014-12-20 DIAGNOSIS — Z720 Tobacco use: Secondary | ICD-10-CM | POA: Insufficient documentation

## 2014-12-20 DIAGNOSIS — Z9889 Other specified postprocedural states: Secondary | ICD-10-CM | POA: Insufficient documentation

## 2014-12-20 DIAGNOSIS — Z8541 Personal history of malignant neoplasm of cervix uteri: Secondary | ICD-10-CM | POA: Insufficient documentation

## 2014-12-20 DIAGNOSIS — R110 Nausea: Secondary | ICD-10-CM | POA: Insufficient documentation

## 2014-12-20 MED ORDER — GABAPENTIN 300 MG PO CAPS: 300.0000 mg | ORAL_CAPSULE | Freq: Two times a day (BID) | ORAL | Status: DC

## 2014-12-20 MED ORDER — MORPHINE SULFATE 4 MG/ML IJ SOLN
4.0000 mg | INTRAMUSCULAR | Status: DC | PRN
Start: 2014-12-20 — End: 2014-12-20
  Administered 2014-12-20: 4 mg via INTRAVENOUS
  Filled 2014-12-20: qty 1

## 2014-12-20 MED ORDER — HYDROCODONE-ACETAMINOPHEN 10-325 MG PO TABS: 1.0000 | ORAL_TABLET | Freq: Four times a day (QID) | ORAL | Status: DC | PRN

## 2014-12-20 MED ORDER — DEXAMETHASONE SODIUM PHOSPHATE 10 MG/ML IJ SOLN
10.0000 mg | Freq: Once | INTRAMUSCULAR | Status: AC
  Administered 2014-12-20: 10 mg via INTRAVENOUS
  Filled 2014-12-20: qty 1

## 2014-12-20 MED ORDER — ONDANSETRON HCL 4 MG/2ML IJ SOLN
4.0000 mg | Freq: Once | INTRAMUSCULAR | Status: AC
  Administered 2014-12-20: 4 mg via INTRAVENOUS
  Filled 2014-12-20: qty 2

## 2014-12-20 MED ORDER — METHYLPREDNISOLONE 4 MG PO KIT: PACK | ORAL | Status: DC

## 2014-12-20 NOTE — ED Provider Notes (Signed)
CSN: [id number]     Arrival date & time 12/20/14  0919 History  This chart was scribed for Tanna Furry, MD by Stephania Fragmin, ED Scribe. This patient was seen in room APA12/APA12 and the patient's care was started at 9:37 AM.    Chief Complaint  Patient presents with  . Back Pain   The history is provided by the patient. No language interpreter was used.     HPI Comments: Toni Chan is a 49 y.o. female with a history of lumbar fusion and subsequent sacral and subsequent coccyx removal due to complicating post surgical MRSA infection and also describes her sacrum was "shattered" who presents to the Emergency Department complaining of intermittent burning pain in her head, neck, and bilateral lower extremities that began 3 days ago. Patient had been seen at a PCP, Dr. Ree Kida, about 17 days ago for chronic lower back pain radiating into her right LE, and was placed on Cymbalta. She states she then had a feeling of "heat" and heaviness around her head that began 3 days ago that "feels like someone is sticking a hot iron on my head." Her burning pain then spread to her posterior neck (left sided neck pain that "shoots straight down"), shoulder blades, and legs. The radiating pain that she had in her right LE spread into her toes. She also complains of associated nausea and dizziness. Patient states her pain is exacerbated by activity, including common household chores like tidying up. Patient had tried to schedule a follow-up appointment with her PCP but has been unable because he is on vacation.   She states she last had an MRI of her neck and thoracic spine during her last back surgery and removal of coccyx in [place], [place]. Patient states she used to take Vicodin and Percocet prescribed to her in the past several years ago before her surgery for the chronic back pain, but stopped because she felt "it was like placing a band-aid on the actual  Problem." She also took Gabapentin that she was taken off of after  her first surgery.   She denies rash, blisters, or incontinence.   Past Medical History  Diagnosis Date  . IBS (irritable bowel syndrome)   . Chronic back pain   . Cervical cancer 1989    Vaginal hysterectomy and oophorectomy 1993  . Kidney stones 2014  . Renal cyst 2014  . Endometriosis   . GERD (gastroesophageal reflux disease)   . Superficial thrombophlebitis   . Placental abruption    Past Surgical History  Procedure Laterality Date  . Coccyx removal  2014  . Abdominal hysterectomy    . Cesarean section  1992  . Back surgery  2013, 2014 X2   Family History  Problem Relation Age of Onset  . Depression Mother   . Alcohol abuse Father   . Heart disease Father   . Kidney disease Father   . Alcohol abuse Brother   . Heart disease Brother   . Drug abuse Brother    History  Substance Use Topics  . Smoking status: Current Every Day Smoker -- 0.50 packs/day    Types: Cigarettes  . Smokeless tobacco: Not on file  . Alcohol Use: No   OB History    Gravida Para Term Preterm AB TAB SAB Ectopic Multiple Living            2     Review of Systems  Constitutional: Negative for fever, chills, diaphoresis, appetite change and fatigue.  HENT: Negative  for mouth sores, sore throat and trouble swallowing.   Eyes: Negative for visual disturbance.  Respiratory: Negative for cough, chest tightness, shortness of breath and wheezing.   Cardiovascular: Negative for chest pain.  Gastrointestinal: Positive for nausea. Negative for vomiting, abdominal pain, diarrhea and abdominal distention.  Endocrine: Negative for polydipsia, polyphagia and polyuria.  Genitourinary: Negative for dysuria, frequency and hematuria.       Negative for incontinence  Musculoskeletal: Positive for myalgias, back pain and neck pain. Negative for gait problem.  Skin: Negative for color change, pallor and rash.  Neurological: Positive for dizziness. Negative for syncope, light-headedness and headaches.   Hematological: Does not bruise/bleed easily.  Psychiatric/Behavioral: Negative for behavioral problems and confusion.      Allergies  Review of patient's allergies indicates no known allergies.  Home Medications   Prior to Admission medications   Medication Sig Start Date End Date Taking? Authorizing Provider  DULoxetine (CYMBALTA) 30 MG capsule Take 1 capsule (30 mg total) by mouth daily. 12/04/14  Yes Lupita Dawn, MD  omeprazole (PRILOSEC) 20 MG capsule Take 1 capsule (20 mg total) by mouth daily. Patient taking differently: Take 20 mg by mouth daily as needed (patient does not take on a daily basis, only takes when needed.).  12/03/14  Yes Lupita Dawn, MD  pregabalin (LYRICA) 75 MG capsule Take 1 capsule (75 mg total) by mouth 2 (two) times daily. Patient taking differently: Take 75 mg by mouth 2 (two) times daily. Patient has prescription at pharmacy, can not afford. But is trying to get help from [employer]. 12/07/14  Yes Lupita Dawn, MD  gabapentin (NEURONTIN) 300 MG capsule Take 1 capsule (300 mg total) by mouth 2 (two) times daily. 12/20/14   Tanna Furry, MD  HYDROcodone-acetaminophen (NORCO) 10-325 MG per tablet Take 1 tablet by mouth every 6 (six) hours as needed. 12/20/14   Tanna Furry, MD  methylPREDNISolone (MEDROL DOSEPAK) 4 MG tablet 6 po on day 1, decrease by 1 tab per day 12/20/14   Tanna Furry, MD  oxyCODONE-acetaminophen (PERCOCET/ROXICET) 5-325 MG per tablet Take 1 tablet by mouth every 4 (four) hours as needed. Patient not taking: Reported on 12/20/2014 11/16/14   Evalee Jefferson, PA-C   BP 99/62 mmHg  Pulse 64  Temp(Src) 97.8 F (36.6 C) (Oral)  Resp 16  Ht 5\' 3"  (1.6 m)  Wt 155 lb (70.308 kg)  BMI 27.46 kg/m2  SpO2 99% Physical Exam  Constitutional: She is oriented to person, place, and time. She appears well-developed and well-nourished. No distress.  HENT:  Head: Normocephalic.  Eyes: Conjunctivae are normal. Pupils are equal, round, and reactive to light. No scleral  icterus.  Neck: Normal range of motion. Neck supple. No thyromegaly present.  Cardiovascular: Normal rate and regular rhythm.  Exam reveals no gallop and no friction rub.   No murmur heard. Pulmonary/Chest: Effort normal and breath sounds normal. No respiratory distress. She has no wheezes. She has no rales.  Abdominal: Soft. Bowel sounds are normal. She exhibits no distension. There is no tenderness. There is no rebound.  Musculoskeletal: Normal range of motion.  Neurological: She is alert and oriented to person, place, and time.  Reflex Scores:      Patellar reflexes are 2+ on the right side and 2+ on the left side.      Achilles reflexes are 2+ on the right side and 2+ on the left side. No localized weakness. 5/5 strength in all 4 extremities.  Skin: Skin is warm  and dry. No rash noted.  Normal skin along head and neck.  Psychiatric: She has a normal mood and affect. Her behavior is normal.  Nursing note and vitals reviewed.   ED Course  Procedures (including critical care time)  DIAGNOSTIC STUDIES: Oxygen Saturation is 100% on room air, normal by my interpretation.    COORDINATION OF CARE: 9:45 AM - Discussed treatment plan with pt at bedside which includes pain medication and MRI to rule out acute hernia of neck or back, and pt agreed to plan.   Labs Review Labs Reviewed - No data to display  Imaging Review Mr Cervical Spine Wo Contrast  12/20/2014   CLINICAL DATA:  Severe "hot poker like" neck pain for the past four days without a known injury. Prior posterior lumbar fusion.  EXAM: MRI CERVICAL AND LUMBAR SPINE WITHOUT CONTRAST  TECHNIQUE: Multiplanar and multiecho pulse sequences of the cervical spine, to include the craniocervical junction and cervicothoracic junction, and lumbar spine, were obtained without intravenous contrast.  COMPARISON:  MR lumbar spine 08/02/2012  FINDINGS: MRI CERVICAL SPINE FINDINGS  The cervical cord is normal in size and signal. Vertebral body  heights are maintained. The disc spaces are preserved. The cervical spine is normal in lordotic alignment. No static listhesis. Bone marrow signal is normal. Cerebellar tonsils are normal in position.  C2-3: No significant disc bulge. No neural foraminal stenosis. No central canal stenosis.  C3-4: Mild broad-based disc bulge. Right uncovertebral degenerative change with mild right foraminal stenosis. No left foraminal stenosis. No central canal stenosis.  C4-5: No significant disc bulge. No neural foraminal stenosis. No central canal stenosis.  C5-6: Shallow right paracentral disc protrusion. Right uncovertebral degenerative change with mild right foraminal stenosis. No left foraminal stenosis. No central canal stenosis.  C6-7: Broad shallow central disc protrusion. No neural foraminal stenosis. No central canal stenosis.  C7-T1: No significant disc bulge. No neural foraminal stenosis. No central canal stenosis.  MRI LUMBAR SPINE FINDINGS  The vertebral bodies of the lumbar spine are normal in size. The vertebral bodies of the lumbar spine are normal in alignment. There is normal bone marrow signal demonstrated throughout the vertebra. Posterior lumbar interbody fusion at L5-S1 with bilateral pedicle screws and interbody spacer device in satisfactory position. The remainder the disc spaces are preserved.  The spinal cord is normal in signal and contour. The cord terminates normally at L1 . The nerve roots of the cauda equina and the filum terminale are normal.  The visualized portions of the SI joints are unremarkable.  The imaged intra-abdominal contents are unremarkable.  T11-12: Mild broad-based disc bulge. No foraminal or central canal stenosis.  T12-L1: No significant disc bulge. No evidence of neural foraminal stenosis. No central canal stenosis.  L1-L2: Minimal broad-based disc bulge. No evidence of neural foraminal stenosis. No central canal stenosis.  L2-L3: No significant disc bulge. No evidence of neural  foraminal stenosis. No central canal stenosis.  L3-L4: No significant disc bulge. No evidence of neural foraminal stenosis. No central canal stenosis.  L4-L5: No significant disc bulge. No evidence of neural foraminal stenosis. No central canal stenosis.  L5-S1: Interbody fusion. No significant foraminal or central canal stenosis.  IMPRESSION: CERVICAL SPINE:  1. At C3-4 there is a mild broad-based disc bulge with right uncovertebral degenerative change resulting in mild right foraminal stenosis. 2. At C5-6 there is a shallow right paracentral disc protrusion. Right uncovertebral degenerative changes with mild right foraminal stenosis. 3. At C6-7 there is a broad shallow central  disc protrusion. LUMBAR SPINE:  1. At L5-S1 there is prior posterior lumbar interbody fusion without significant foraminal or central canal stenosis. 2. No significant lumbar spine disc protrusion, foraminal stenosis or central canal stenosis.   Electronically Signed   By: Kathreen Devoid   On: 12/20/2014 11:52   Mr Lumbar Spine Wo Contrast  12/20/2014   CLINICAL DATA:  Severe "hot poker like" neck pain for the past four days without a known injury. Prior posterior lumbar fusion.  EXAM: MRI CERVICAL AND LUMBAR SPINE WITHOUT CONTRAST  TECHNIQUE: Multiplanar and multiecho pulse sequences of the cervical spine, to include the craniocervical junction and cervicothoracic junction, and lumbar spine, were obtained without intravenous contrast.  COMPARISON:  MR lumbar spine 08/02/2012  FINDINGS: MRI CERVICAL SPINE FINDINGS  The cervical cord is normal in size and signal. Vertebral body heights are maintained. The disc spaces are preserved. The cervical spine is normal in lordotic alignment. No static listhesis. Bone marrow signal is normal. Cerebellar tonsils are normal in position.  C2-3: No significant disc bulge. No neural foraminal stenosis. No central canal stenosis.  C3-4: Mild broad-based disc bulge. Right uncovertebral degenerative change  with mild right foraminal stenosis. No left foraminal stenosis. No central canal stenosis.  C4-5: No significant disc bulge. No neural foraminal stenosis. No central canal stenosis.  C5-6: Shallow right paracentral disc protrusion. Right uncovertebral degenerative change with mild right foraminal stenosis. No left foraminal stenosis. No central canal stenosis.  C6-7: Broad shallow central disc protrusion. No neural foraminal stenosis. No central canal stenosis.  C7-T1: No significant disc bulge. No neural foraminal stenosis. No central canal stenosis.  MRI LUMBAR SPINE FINDINGS  The vertebral bodies of the lumbar spine are normal in size. The vertebral bodies of the lumbar spine are normal in alignment. There is normal bone marrow signal demonstrated throughout the vertebra. Posterior lumbar interbody fusion at L5-S1 with bilateral pedicle screws and interbody spacer device in satisfactory position. The remainder the disc spaces are preserved.  The spinal cord is normal in signal and contour. The cord terminates normally at L1 . The nerve roots of the cauda equina and the filum terminale are normal.  The visualized portions of the SI joints are unremarkable.  The imaged intra-abdominal contents are unremarkable.  T11-12: Mild broad-based disc bulge. No foraminal or central canal stenosis.  T12-L1: No significant disc bulge. No evidence of neural foraminal stenosis. No central canal stenosis.  L1-L2: Minimal broad-based disc bulge. No evidence of neural foraminal stenosis. No central canal stenosis.  L2-L3: No significant disc bulge. No evidence of neural foraminal stenosis. No central canal stenosis.  L3-L4: No significant disc bulge. No evidence of neural foraminal stenosis. No central canal stenosis.  L4-L5: No significant disc bulge. No evidence of neural foraminal stenosis. No central canal stenosis.  L5-S1: Interbody fusion. No significant foraminal or central canal stenosis.  IMPRESSION: CERVICAL SPINE:  1. At  C3-4 there is a mild broad-based disc bulge with right uncovertebral degenerative change resulting in mild right foraminal stenosis. 2. At C5-6 there is a shallow right paracentral disc protrusion. Right uncovertebral degenerative changes with mild right foraminal stenosis. 3. At C6-7 there is a broad shallow central disc protrusion. LUMBAR SPINE:  1. At L5-S1 there is prior posterior lumbar interbody fusion without significant foraminal or central canal stenosis. 2. No significant lumbar spine disc protrusion, foraminal stenosis or central canal stenosis.   Electronically Signed   By: Kathreen Devoid   On: 12/20/2014 11:52  EKG Interpretation None      MDM   Final diagnoses:  Neck pain  Sciatica, right    I personally performed the services described in this documentation, which was scribed in my presence. The recorded information has been reviewed and is accurate.  Patient without frank neurological findings. No abnormalities to explain a burning sensation in her neck. I did tell her that there is described 1-2% incidence of paresthesia with Cymbalta. She'll hold her Cymbalta. Prescription for Lyrica, Medrol Dosepak, short course Vicodin prescribed for follow-up.   Tanna Furry, MD 12/20/14 1345

## 2014-12-20 NOTE — ED Notes (Signed)
Wheelchair offered and pt insisted on ambulating out, ambulated out with steady gait

## 2014-12-20 NOTE — Discharge Instructions (Signed)
Back Pain, Adult °Back pain is very common. The pain often gets better over time. The cause of back pain is usually not dangerous. Most people can learn to manage their back pain on their own.  °HOME CARE  °· Stay active. Start with short walks on flat ground if you can. Try to walk farther each day. °· Do not sit, drive, or stand in one place for more than 30 minutes. Do not stay in bed. °· Do not avoid exercise or work. Activity can help your back heal faster. °· Be careful when you bend or lift an object. Bend at your knees, keep the object close to you, and do not twist. °· Sleep on a firm mattress. Lie on your side, and bend your knees. If you lie on your back, put a pillow under your knees. °· Only take medicines as told by your doctor. °· Put ice on the injured area. °¨ Put ice in a plastic bag. °¨ Place a towel between your skin and the bag. °¨ Leave the ice on for 15-20 minutes, 03-04 times a day for the first 2 to 3 days. After that, you can switch between ice and heat packs. °· Ask your doctor about back exercises or massage. °· Avoid feeling anxious or stressed. Find good ways to deal with stress, such as exercise. °GET HELP RIGHT AWAY IF:  °· Your pain does not go away with rest or medicine. °· Your pain does not go away in 1 week. °· You have new problems. °· You do not feel well. °· The pain spreads into your legs. °· You cannot control when you poop (bowel movement) or pee (urinate). °· Your arms or legs feel weak or lose feeling (numbness). °· You feel sick to your stomach (nauseous) or throw up (vomit). °· You have belly (abdominal) pain. °· You feel like you may pass out (faint). °MAKE SURE YOU:  °· Understand these instructions. °· Will watch your condition. °· Will get help right away if you are not doing well or get worse. °Document Released: 03/02/2008 Document Revised: 12/07/2011 Document Reviewed: 01/16/2014 °ExitCare® Patient Information ©2015 [employer]. This information is not intended  to replace advice given to you by your health care provider. Make sure you discuss any questions you have with your health care provider. ° °

## 2014-12-20 NOTE — ED Notes (Signed)
PT c/o lower back pain worsening x1 week and has been seen previously for same and had f/u with primary MD. PT denies any urinary symptoms or any injury.

## 2014-12-20 NOTE — ED Notes (Signed)
Pt verbalized understanding of no driving and to use caution within 4 hours of taking pain meds due to meds cause drowsiness 

## 2014-12-24 ENCOUNTER — Telehealth: Payer: Self-pay | Admitting: Family Medicine

## 2014-12-24 DIAGNOSIS — M5136 Other intervertebral disc degeneration, lumbar region: Secondary | ICD-10-CM

## 2014-12-24 NOTE — Telephone Encounter (Signed)
Pt called to let us know that she was seen in the ER because she couldn't walk. They did an MRI and told her she had 4 bulging discs which is causing pain in right leg. They prescribed her gabapentin and hydrocodone, also told her to follow up with pcp.  Pt says she will run out of meds soon and wanted to know if pcp will give her a refill until her appt. Pt informed that she will have to make an appt but she states that she cant take the pain that long. She was given the option to make an earlier appt but she thinks the pcp should fill rx until then. Please advise. Deseree Kennon Holter, CMA

## 2014-12-24 NOTE — Telephone Encounter (Signed)
Would like for clinical staff to call her concerning "a change in her situation" / Toni Chan, ASA

## 2014-12-25 MED ORDER — GABAPENTIN 300 MG PO CAPS: 300.0000 mg | ORAL_CAPSULE | Freq: Three times a day (TID) | ORAL | Status: DC

## 2014-12-25 MED ORDER — HYDROCODONE-ACETAMINOPHEN 10-325 MG PO TABS: 1.0000 | ORAL_TABLET | Freq: Four times a day (QID) | ORAL | Status: DC | PRN

## 2014-12-25 NOTE — Telephone Encounter (Signed)
Pt called as is waiting for Dr. Ree Chan to call her. She is having some issues with pain and doesn't know what she is supposed to do. Please call patient. jw

## 2014-12-25 NOTE — Telephone Encounter (Signed)
I was out of office last week and therefore was not able to respond to this message. Please see phone note with patient from today 12/25/14

## 2014-12-25 NOTE — Telephone Encounter (Signed)
Spoke to patient. Seen in ED on 3/24 for increasing neck and low back pain. MRI cervical spine and [place] spine showed multiple areas of DDD and postoperative changes of lumbar spine. Prescribed Gabapentin 300 mg BID and Norco 10-325 q6 prn, still reports significant pain.   Patient has follow up scheduled with me in early April. Paper script for Gabapentin 300 TID. She is still waiting to get Lyrica through [employer]. Also provided paper script for Norco 60 tabs. Scripts placed in front office for patient pickup.  Referral to orthopedic surgery made.  Patient reports history of osteopenia when younger, would like to know if Dexa scan would be beneficial, will discuss in more detail at next visit.

## 2014-12-31 ENCOUNTER — Telehealth: Payer: Self-pay | Admitting: Family Medicine

## 2014-12-31 NOTE — Telephone Encounter (Signed)
Pt is checking status of surgery referral

## 2014-12-31 NOTE — Telephone Encounter (Signed)
Pt informed that notes faxed to [facility] and she was given their number to call. Deseree Kennon Holter, CMA

## 2015-01-01 ENCOUNTER — Ambulatory Visit (INDEPENDENT_AMBULATORY_CARE_PROVIDER_SITE_OTHER): Payer: Self-pay | Admitting: Family Medicine

## 2015-01-01 VITALS — BP 137/90 | HR 74 | Temp 98.4°F | Ht 63.0 in | Wt 158.2 lb

## 2015-01-01 DIAGNOSIS — Z7189 Other specified counseling: Secondary | ICD-10-CM

## 2015-01-01 DIAGNOSIS — K219 Gastro-esophageal reflux disease without esophagitis: Secondary | ICD-10-CM

## 2015-01-01 DIAGNOSIS — F32A Depression, unspecified: Secondary | ICD-10-CM

## 2015-01-01 DIAGNOSIS — G8929 Other chronic pain: Secondary | ICD-10-CM

## 2015-01-01 DIAGNOSIS — F329 Major depressive disorder, single episode, unspecified: Secondary | ICD-10-CM

## 2015-01-01 DIAGNOSIS — M5136 Other intervertebral disc degeneration, lumbar region: Secondary | ICD-10-CM

## 2015-01-01 MED ORDER — HYDROCODONE-ACETAMINOPHEN 10-325 MG PO TABS: 1.0000 | ORAL_TABLET | Freq: Four times a day (QID) | ORAL | Status: DC | PRN

## 2015-01-01 MED ORDER — GABAPENTIN 300 MG PO CAPS
600.0000 mg | ORAL_CAPSULE | Freq: Three times a day (TID) | ORAL | Status: DC
Start: 2015-01-01 — End: 2015-02-11

## 2015-01-01 NOTE — Assessment & Plan Note (Addendum)
Symptoms currently controlled by omeprazole.   Written by: Dois Davenport, MS3  I agree with the above assessment. Dossie Arbour MD

## 2015-01-01 NOTE — Assessment & Plan Note (Addendum)
Depressive symptoms exacerbated due to increased financial pressures, familial stress, and unrelenting pain. No current suicidal ideation. - continue Cymbalta - advised patient to contact [facility] to set up an appointment - advised patient to contact us should her mood worsen   Written by: Dois Davenport, MS3  I agree with the above plan. Consider increasing Cymbalta to 60 mg at future visits to help with mood and chronic pain. Dossie Arbour MD

## 2015-01-01 NOTE — Progress Notes (Signed)
Subjective:     Patient ID: Toni Chan, female   DOB: Feb 10, 1966, 49 y.o.   MRN: [id number]  HPI   Back Pain: Toni Chan is a 49 year old female who presents today to follow-up on her low back pain. She has had multiple surgeries on her back, with no pain relief. Today she describes pain in the lumbar region that radiates down her right leg to her toes. She describes associated numbness and burning.The pain is worse when she drives and stands. She only gets relief from hydrocodone. She reports taking the hydrocodone 6x/day when the pain is at its worst, instead of the 4x/day she is prescribed. She is also taking Gabapentin 300 mg TID and Cymbalta 30 mg daily, she is uncertain if this if helping her back pain. Patient has yet to receive Lyrica through the mail from [employer] (previously completed patient assistance paperwork). No bladder or bowel incontinence.   She also reports a subscapular pain that began yesterday as she was twisting her back to turn around. At onset, the pain was so severe she experienced shortness of breath. It has since become less acute, but it exacerbated by movement.  She is currently taking hydrocodone, gabapentin, and cymbalta for her symptoms.  Depression: She is experiencing a particularly depressed mood lately and has been under increased stress due to financial pressures and familial concerns. She is very restricted in activities by her back pain. She reports episodes of crying. She currently has no suicidal ideation. She has a strong family and personal history of depression-- her mother committed suicide and the patient has been previously hospitalized for depression. She also reports poor sleep and concentration. She is interested in referral to behavioral health.   GERD: omeprazole relieves symptoms  Review of Systems See HPI.     Objective:   Physical Exam  BP 137/90 mmHg  Pulse 74  Temp(Src) 98.4 F (36.9 C) (Oral)  Ht 5\' 3"  (1.6 m)  Wt 158 lb 3.2 oz  (71.759 kg)  BMI 28.03 kg/m2  Gen: pleasant female, NAD CV: RRR, no MRG, nl S1 and S2 Pulmonary: CTAB, normal work of breathing MSK: tenderness to palpation along lumbar region of back, scapular muscle tenderness, right-modified straight leg raise elicited pain in back and posterior aspect of right leg, left-modified straight leg raise normal Psych: dressed appropriately, mood is described as depressed, affect is outgoing, occasional crying during exam, mild pressured speech when upset, no tangential thoughts, no SI, no HI, does not appear to be responding to internal stimuli     Assessment:     Please see Problem List.       Plan:     Please see Problem List     Written by: Dois Davenport, MS3  I personally saw and evaluated the patient. I have reviewed the medical student note and agree with the documentation. I personally completed the physical exam. Additions to the medical student note are made in blue.   Dossie Arbour MD

## 2015-01-01 NOTE — Patient Instructions (Signed)
Back Pain - increase Gabapentin to 600 mg three times a day, continue Cymbalta 30 mg daily, continue Norco as needed for pain  Mood - please call Dr. Ree Kida immediately if you start to feel more depressed, here is the number for [facility] [phone]   Return in one month

## 2015-01-01 NOTE — Assessment & Plan Note (Addendum)
Lumbar back pain with radiation down the right leg is unchanged from last visit. - Orthopedics appointment 01/10/2015 - Gabapentin: increase to 600mg  3x/day - Hydrocodone: continue taking 4x/day, avoid 6x/day increased dosing  - Lyrica: Comptroller by: Dois Davenport, MS3  Chronic lumbar back pain improved with Gabapentin/Cymbalta/Norco -increase Gabapentin to 600 mg TID -Provided enough Norco to last for 30 days -continue current dose of Cymbalta -return in one month  Dossie Arbour MD

## 2015-01-02 DIAGNOSIS — G8929 Other chronic pain: Secondary | ICD-10-CM | POA: Insufficient documentation

## 2015-01-02 LAB — DRUG SCR UR, PAIN MGMT, REFLEX CONF
AMPHETAMINE SCRN UR: NEGATIVE
Barbiturate Quant, Ur: NEGATIVE
Benzodiazepines.: NEGATIVE
COCAINE METABOLITES: NEGATIVE
Creatinine,U: 39.76 mg/dL
Marijuana Metabolite: NEGATIVE
Methadone: NEGATIVE
PHENCYCLIDINE (PCP): NEGATIVE
Propoxyphene: NEGATIVE

## 2015-01-02 NOTE — Assessment & Plan Note (Signed)
Initiated opioid therapy for Lumbar DDD. Concurrently titrating Cymbalta and Gabapentin to decrease need for opioid narcotics.  -follow up in one month for med refill -UDS obtained/pain contract signed/reviewed narcotic database

## 2015-01-05 LAB — OPIATES/OPIOIDS (LC/MS-MS)
Codeine Urine: NEGATIVE ng/mL (ref <50)
Hydrocodone: 1568 ng/mL — ABNORMAL HIGH (ref <50)
Hydromorphone: 293 ng/mL — ABNORMAL HIGH (ref <50)
Morphine Urine: NEGATIVE ng/mL (ref <50)
NOROXYCODONE, UR: NEGATIVE ng/mL (ref <50)
Norhydrocodone, Ur: 2138 ng/mL — ABNORMAL HIGH (ref <50)
Oxycodone, ur: NEGATIVE ng/mL (ref <50)
Oxymorphone: NEGATIVE ng/mL (ref <50)

## 2015-01-07 ENCOUNTER — Encounter: Payer: Self-pay | Admitting: Family Medicine

## 2015-01-07 ENCOUNTER — Ambulatory Visit: Payer: Self-pay | Admitting: Family Medicine

## 2015-01-14 ENCOUNTER — Ambulatory Visit: Payer: Self-pay

## 2015-01-16 ENCOUNTER — Other Ambulatory Visit: Payer: Self-pay | Admitting: Orthopaedic Surgery

## 2015-01-16 DIAGNOSIS — G8929 Other chronic pain: Secondary | ICD-10-CM

## 2015-01-16 DIAGNOSIS — M545 Low back pain, unspecified: Secondary | ICD-10-CM

## 2015-01-17 ENCOUNTER — Telehealth: Payer: Self-pay | Admitting: Family Medicine

## 2015-01-18 ENCOUNTER — Ambulatory Visit
Admission: RE | Admit: 2015-01-18 | Discharge: 2015-01-18 | Disposition: A | Discharge status: Home / Self Care | Payer: No Typology Code available for payment source | Source: Ambulatory Visit | Attending: Orthopaedic Surgery | Admitting: Orthopaedic Surgery

## 2015-01-18 VITALS — BP 103/55 | HR 56

## 2015-01-18 DIAGNOSIS — M545 Low back pain, unspecified: Secondary | ICD-10-CM

## 2015-01-18 DIAGNOSIS — G8929 Other chronic pain: Secondary | ICD-10-CM

## 2015-01-18 DIAGNOSIS — M5136 Other intervertebral disc degeneration, lumbar region: Secondary | ICD-10-CM

## 2015-01-18 MED ORDER — ONDANSETRON HCL 4 MG/2ML IJ SOLN
4.0000 mg | Freq: Once | INTRAMUSCULAR | Status: AC
  Administered 2015-01-18: 4 mg via INTRAMUSCULAR

## 2015-01-18 MED ORDER — MEPERIDINE HCL 100 MG/ML IJ SOLN
100.0000 mg | Freq: Once | INTRAMUSCULAR | Status: AC
  Administered 2015-01-18: 100 mg via INTRAMUSCULAR

## 2015-01-18 MED ORDER — DIAZEPAM 5 MG PO TABS
10.0000 mg | ORAL_TABLET | Freq: Once | ORAL | Status: AC
  Administered 2015-01-18: 10 mg via ORAL

## 2015-01-18 MED ORDER — ONDANSETRON HCL 4 MG/2ML IJ SOLN: 4.0000 mg | Freq: Four times a day (QID) | INTRAMUSCULAR | Status: DC | PRN

## 2015-01-18 MED ORDER — IOHEXOL 180 MG/ML  SOLN
15.0000 mL | Freq: Once | INTRAMUSCULAR | Status: AC | PRN
  Administered 2015-01-18: 15 mL via INTRATHECAL

## 2015-01-18 NOTE — Progress Notes (Signed)
Pt states she has been off Cymbalta for the past 2 days.   Discharge instructions explained to pt. 

## 2015-01-18 NOTE — Discharge Instructions (Signed)
Myelogram Discharge Instructions  1. Go home and rest quietly for the next 24 hours.  It is important to lie flat for the next 24 hours.  Get up only to go to the restroom.  You may lie in the bed or on a couch on your back, your stomach, your left side or your right side.  You may have one pillow under your head.  You may have pillows between your knees while you are on your side or under your knees while you are on your back.  2. DO NOT drive today.  Recline the seat as far back as it will go, while still wearing your seat belt, on the way home.  3. You may get up to go to the bathroom as needed.  You may sit up for 10 minutes to eat.  You may resume your normal diet and medications unless otherwise indicated.  Drink lots of extra fluids today and tomorrow.  4. The incidence of headache, nausea, or vomiting is about 5% (one in 20 patients).  If you develop a headache, lie flat and drink plenty of fluids until the headache goes away.  Caffeinated beverages may be helpful.  If you develop severe nausea and vomiting or a headache that does not go away with flat bed rest, call [phone].  5. You may resume normal activities after your 24 hours of bed rest is over; however, do not exert yourself strongly or do any heavy lifting tomorrow. If when you get up you have a headache when standing, go back to bed and force fluids for another 24 hours.  6. Call your physician for a follow-up appointment.  The results of your myelogram will be sent directly to your physician by the following day.  7. If you have any questions or if complications develop after you arrive home, please call [phone].  Discharge instructions have been explained to the patient.  The patient, or the person responsible for the patient, fully understands these instructions.       May resume Cymbalta on January 19, 2015, after 9:30 am.

## 2015-02-11 ENCOUNTER — Ambulatory Visit (INDEPENDENT_AMBULATORY_CARE_PROVIDER_SITE_OTHER): Payer: Self-pay | Admitting: Family Medicine

## 2015-02-11 ENCOUNTER — Other Ambulatory Visit (HOSPITAL_COMMUNITY): Payer: Self-pay | Admitting: Orthopaedic Surgery

## 2015-02-11 ENCOUNTER — Encounter: Payer: Self-pay | Admitting: Family Medicine

## 2015-02-11 ENCOUNTER — Telehealth: Payer: Self-pay | Admitting: Family Medicine

## 2015-02-11 VITALS — BP 112/81 | HR 55 | Temp 98.3°F | Ht 63.0 in | Wt 155.5 lb

## 2015-02-11 DIAGNOSIS — M5136 Other intervertebral disc degeneration, lumbar region: Secondary | ICD-10-CM

## 2015-02-11 DIAGNOSIS — F32A Depression, unspecified: Secondary | ICD-10-CM

## 2015-02-11 DIAGNOSIS — G8929 Other chronic pain: Secondary | ICD-10-CM

## 2015-02-11 DIAGNOSIS — F329 Major depressive disorder, single episode, unspecified: Secondary | ICD-10-CM

## 2015-02-11 DIAGNOSIS — Z7189 Other specified counseling: Secondary | ICD-10-CM

## 2015-02-11 MED ORDER — DULOXETINE HCL 60 MG PO CPEP: 30.0000 mg | ORAL_CAPSULE | Freq: Every day | ORAL | Status: DC

## 2015-02-11 MED ORDER — HYDROCODONE-ACETAMINOPHEN 10-325 MG PO TABS: 1.0000 | ORAL_TABLET | Freq: Four times a day (QID) | ORAL | Status: DC | PRN

## 2015-02-11 MED ORDER — GABAPENTIN 300 MG PO CAPS: 600.0000 mg | ORAL_CAPSULE | Freq: Three times a day (TID) | ORAL | Status: DC

## 2015-02-11 NOTE — Assessment & Plan Note (Addendum)
Imaging completed at [facility] per orthopedist Dr. Lorin Mercy showed displacement of hardware in lower back. Surgery is scheduled at [facility] 03/06/2015.  - refilled Norco 10-325 and Gabapentin 600mg  TID - increased Cymbalta to 60mg  - follow-up after recovery from surgery  Dois Davenport, MS3  I agree with the medical student documentation. -provided 120 Norco (will quickly wean after upcoming surgery) -increased Cymbalta to 60 mg daily -continue Gabapentin 600 TID  Dossie Arbour MD

## 2015-02-11 NOTE — Assessment & Plan Note (Addendum)
Continued depressed mood on 30mg  Cymbalta with one panic episode. No SI/HI. - increased Cymbalta to 60mg   - provided phone number to [place], [place]  I agree with the medical student assessment and plan. Patient still depressed due to severe low back pain and chronic medical conditions. -increase Cymbalta to 60 mg daily -return for follow up in one month  Dossie Arbour MD

## 2015-02-11 NOTE — Assessment & Plan Note (Signed)
See Note for chronic low back pain -provided 120 Norco 10-325 -Will plan to wean after upcoming surgery

## 2015-02-11 NOTE — Telephone Encounter (Signed)
Toni Chan at [facility] called today and move surgery to June 1 Just wanted dr Ree Kida to know

## 2015-02-11 NOTE — Progress Notes (Signed)
Subjective:     Patient ID: Toni Chan, female   DOB: [birth year], 49 y.o.   MRN: [id number]  Written by: Toni Chan, MS3 HPI Toni Chan is a 49 year old female who presents today for follow up of her depressed mood and low back pain.  Low back pain: Imaging completed by orthopedics shows displacement of hardware in her back. Surgery scheduled for 03/06/2015. Patient is in 10/10 pain radiating down her left leg constantly, with intermittent radiation down her right leg. No bowel or bladder incontinence and no perineal numbness. Patient reports feeling weak in both legs. She is taking Norco 10-325 every 4 hours and 600mg  Gabapentin TID with minimal improvement in pain. Lying down alleviates pain. She is still waiting to hear from [employer] about assistance with Lyrica.  Mood: She reports that her mood has been more depressed over the past few weeks. She endorses anhedonia. She is sleeping 10-11 hours per night with no nighttime awakenings, but does not feel refreshed upon waking. She denies SI/HI. Three days ago she had one panic attack with heart racing, increased shortness of breath, and crying. She denies chest pain. She cannot point to a triggering event. After lying down for an hour the episode resolved. She takes 30mg  Cymbalta.   Social History: Patient is no longer driving due to the pain. Her neighbor drives her to doctor's appointments. She lives alone. She visits her brother who lives nearby frequently. She is currently applying for Medicaid and disability.  Social - currently unemployed due to medical issues    Review of Systems See HPI.    Objective:   Physical Exam BP 112/81 mmHg  Pulse 55  Temp(Src) 98.3 F (36.8 C) (Oral)  Ht 5\' 3"  (1.6 m)  Wt 155 lb 8 oz (70.534 kg)  BMI 27.55 kg/m2 Gen: well-appearing, holding lower back due to pain, stood halfway through visit to alleviate pain on sitting, ambulating slowly due to pain HEENT: PERRLA, EOMI. No cervical or submandibular  LAD. No thyromegaly.   CV: RRR, no MRG, nl S1 and S2 Pulm: CTA bilaterally, no wheezes or crackles  Psych: Flat affect. Dressed appropriately. No tangential thoughts or pressured speech. No SI/HI.   CT Lumber Spine 12/2014 IMPRESSION: LUMBAR MYELOGRAM IMPRESSION:  1. Technically successful lumbar puncture at L2-L3 with pencil point needle for lumbar myelogram. 2. L5-S1 posterior lumbar interbody fusion. No visible stenosis. Poor visualization of the S1 nerve roots due to overlying hardware. 3. Mild levoconvex lumbar scoliosis. CT LUMBAR MYELOGRAM IMPRESSION:  L5-S1 PLIF with malposition of the RIGHT S1 pedicle screw. The RIGHT S1 pedicle screw traverses the RIGHT lateral recess and either compresses or transects the RIGHT S1 nerve with effacement of contrast within the RIGHT S1 nerve root sleeve. No evidence of pseudoarthrosis or hardware loosening following discectomy with interbody bone graft. The interbody bone graft is incorporating however there is no solid bridging bone at this time.    Assessment:     Please see Problem List.      Plan:     Please see Problem List.      I personally saw and evaluated the patient. I have reviewed the medical student note and agree with the documentation. I personally completed the physical exam. Additions to the medical student note are made in blue.   Toni Arbour MD

## 2015-02-11 NOTE — Patient Instructions (Addendum)
Great to see you today. Please call the [facility]: [phone] We reordered the Gabapentin and the Hydrocodone. We increased the dose of your Cymbalta to 60mg  once per day.  Please follow up in one month to discuss your mood and re-evaluate the Cymbalta dose.

## 2015-02-13 ENCOUNTER — Telehealth: Payer: Self-pay | Admitting: Family Medicine

## 2015-02-13 NOTE — Telephone Encounter (Signed)
Pt wanted dr Ree Kida to know that her surgery changed to June 1st. Lyrica is getting shipped. And wanted to know when to stop her gabapentin. Per dr Ree Kida, she can stop gabapentin once she gets her lyrica. Pt informed.Toni Chan, CMA

## 2015-02-13 NOTE — Telephone Encounter (Signed)
Pt called and would like to speak to Dr. Ree Kida. She didn't want to say why she needed to talk to him when asked. jw

## 2015-02-18 NOTE — Pre-Procedure Instructions (Signed)
Edmonia Gonser  02/18/2015      [facility], [place] [address] Phone: [phone] Fax: [phone]    Your procedure is scheduled on 02/27/15  Report to [facility] cone short stay admitting at 1020 A.M.  Call this number if you have problems the morning of surgery:  [phone]   Remember:  Do not eat food or drink liquids after midnight.  Take these medicines the morning of surgery with A SIP OF WATER cymbalta,gabapentin,or lyrica,pain med if needed,prilosec     STOP all herbel meds, nsaids (aleve,naproxen,advil,ibuprofen) 5 days prior to surgery starting 02/22/15 including aspirin, vitamins   Do not wear jewelry, make-up or nail polish.  Do not wear lotions, powders, or perfumes.  You may wear deodorant.  Do not shave 48 hours prior to surgery.  Men may shave face and neck.  Do not bring valuables to the hospital.  [facility] is not responsible for any belongings or valuables.  Contacts, dentures or bridgework may not be worn into surgery.  Leave your suitcase in the car.  After surgery it may be brought to your room.  For patients admitted to the hospital, discharge time will be determined by your treatment team.  Patients discharged the day of surgery will not be allowed to drive home.   Name and phone number of your driver:    Special instructions:   Special Instructions: [place] - Preparing for Surgery  Before surgery, you can play an important role.  Because skin is not sterile, your skin needs to be as free of germs as possible.  You can reduce the number of germs on you skin by washing with CHG (chlorahexidine gluconate) soap before surgery.  CHG is an antiseptic cleaner which kills germs and bonds with the skin to continue killing germs even after washing.  Please DO NOT use if you have an allergy to CHG or antibacterial soaps.  If your skin becomes reddened/irritated stop using the CHG and inform your  nurse when you arrive at Short Stay.  Do not shave (including legs and underarms) for at least 48 hours prior to the first CHG shower.  You may shave your face.  Please follow these instructions carefully:   1.  Shower with CHG Soap the night before surgery and the morning of Surgery.  2.  If you choose to wash your hair, wash your hair first as usual with your normal shampoo.  3.  After you shampoo, rinse your hair and body thoroughly to remove the Shampoo.  4.  Use CHG as you would any other liquid soap.  You can apply chg directly  to the skin and wash gently with scrungie or a clean washcloth.  5.  Apply the CHG Soap to your body ONLY FROM THE NECK DOWN.  Do not use on open wounds or open sores.  Avoid contact with your eyes ears, mouth and genitals (private parts).  Wash genitals (private parts)       with your normal soap.  6.  Wash thoroughly, paying special attention to the area where your surgery will be performed.  7.  Thoroughly rinse your body with warm water from the neck down.  8.  DO NOT shower/wash with your normal soap after using and rinsing off the CHG Soap.  9.  Pat yourself dry with a clean towel.  10.  Wear clean pajamas.            11.  Place clean sheets on your bed the night of your first shower and do not sleep with pets.  Day of Surgery  Do not apply any lotions/deodorants the morning of surgery.  Please wear clean clothes to the hospital/surgery center.  Please read over the following fact sheets that you were given. Pain Booklet, Coughing and Deep Breathing, Blood Transfusion Information, MRSA Information and Surgical Site Infection Prevention

## 2015-02-19 ENCOUNTER — Inpatient Hospital Stay (HOSPITAL_COMMUNITY)
Admission: RE | Admit: 2015-02-19 | Discharge: 2015-02-19 | Disposition: A | Discharge status: Home / Self Care | Payer: No Typology Code available for payment source | Source: Ambulatory Visit

## 2015-02-20 ENCOUNTER — Ambulatory Visit (HOSPITAL_COMMUNITY)
Admission: RE | Admit: 2015-02-20 | Discharge: 2015-02-20 | Disposition: A | Discharge status: Home / Self Care | Payer: Medicare Other | Source: Ambulatory Visit | Attending: Orthopaedic Surgery | Admitting: Orthopaedic Surgery

## 2015-02-20 ENCOUNTER — Encounter (HOSPITAL_COMMUNITY)
Admission: RE | Admit: 2015-02-20 | Discharge: 2015-02-20 | Disposition: A | Discharge status: Home / Self Care | Payer: Medicare Other | Source: Ambulatory Visit | Attending: Orthopaedic Surgery | Admitting: Orthopaedic Surgery

## 2015-02-20 ENCOUNTER — Encounter (HOSPITAL_COMMUNITY): Payer: Self-pay

## 2015-02-20 ENCOUNTER — Other Ambulatory Visit (HOSPITAL_COMMUNITY): Payer: Self-pay | Admitting: *Deleted

## 2015-02-20 DIAGNOSIS — F419 Anxiety disorder, unspecified: Secondary | ICD-10-CM | POA: Diagnosis not present

## 2015-02-20 DIAGNOSIS — S32009K Unspecified fracture of unspecified lumbar vertebra, subsequent encounter for fracture with nonunion: Secondary | ICD-10-CM

## 2015-02-20 DIAGNOSIS — F329 Major depressive disorder, single episode, unspecified: Secondary | ICD-10-CM | POA: Diagnosis not present

## 2015-02-20 DIAGNOSIS — T84226A Displacement of internal fixation device of vertebrae, initial encounter: Secondary | ICD-10-CM | POA: Diagnosis not present

## 2015-02-20 DIAGNOSIS — M960 Pseudarthrosis after fusion or arthrodesis: Secondary | ICD-10-CM | POA: Diagnosis not present

## 2015-02-20 HISTORY — DX: Major depressive disorder, single episode, unspecified: F32.9

## 2015-02-20 HISTORY — DX: Unspecified osteoarthritis, unspecified site: M19.90

## 2015-02-20 HISTORY — DX: Pneumonia, unspecified organism: J18.9

## 2015-02-20 HISTORY — DX: Depression, unspecified: F32.A

## 2015-02-20 HISTORY — DX: Personal history of Methicillin resistant Staphylococcus aureus infection: Z86.14

## 2015-02-20 HISTORY — DX: Anxiety disorder, unspecified: F41.9

## 2015-02-20 LAB — COMPREHENSIVE METABOLIC PANEL
ALBUMIN: 4.3 g/dL (ref 3.5–5.0)
ALT: 27 U/L (ref 14–54)
ANION GAP: 9 (ref 5–15)
AST: 29 U/L (ref 15–41)
Alkaline Phosphatase: 77 U/L (ref 38–126)
BILIRUBIN TOTAL: 0.3 mg/dL (ref 0.3–1.2)
BUN: 6 mg/dL (ref 6–20)
CHLORIDE: 104 mmol/L (ref 101–111)
CO2: 23 mmol/L (ref 22–32)
CREATININE: 0.74 mg/dL (ref 0.44–1.00)
Calcium: 9 mg/dL (ref 8.9–10.3)
GFR calc Af Amer: >60 mL/min (ref >60)
GLUCOSE: 77 mg/dL (ref 65–99)
Potassium: 4 mmol/L (ref 3.5–5.1)
SODIUM: 136 mmol/L (ref 135–145)
Total Protein: 7.2 g/dL (ref 6.5–8.1)

## 2015-02-20 LAB — PROTIME-INR
INR: 1.03 (ref 0.00–1.49)
Prothrombin Time: 13.7 seconds (ref 11.6–15.2)

## 2015-02-20 LAB — URINALYSIS, ROUTINE W REFLEX MICROSCOPIC
Bilirubin Urine: NEGATIVE
GLUCOSE, UA: NEGATIVE mg/dL
Hgb urine dipstick: NEGATIVE
Ketones, ur: NEGATIVE mg/dL
Leukocytes, UA: NEGATIVE
NITRITE: NEGATIVE
Protein, ur: NEGATIVE mg/dL
Specific Gravity, Urine: 1.004 — ABNORMAL LOW (ref 1.005–1.030)
Urobilinogen, UA: 0.2 mg/dL (ref 0.0–1.0)
pH: 6.5 (ref 5.0–8.0)

## 2015-02-20 LAB — CBC
HCT: 43 % (ref 36.0–46.0)
Hemoglobin: 14.3 g/dL (ref 12.0–15.0)
MCH: 30.5 pg (ref 26.0–34.0)
MCHC: 33.3 g/dL (ref 30.0–36.0)
MCV: 91.7 fL (ref 78.0–100.0)
PLATELETS: 233 10*3/uL (ref 150–400)
RBC: 4.69 MIL/uL (ref 3.87–5.11)
RDW: 12.6 % (ref 11.5–15.5)
WBC: 9.3 10*3/uL (ref 4.0–10.5)

## 2015-02-20 LAB — TYPE AND SCREEN
ABO/RH(D): O POS
ANTIBODY SCREEN: NEGATIVE

## 2015-02-20 LAB — SURGICAL PCR SCREEN
MRSA, PCR: NEGATIVE
STAPHYLOCOCCUS AUREUS: NEGATIVE

## 2015-02-20 LAB — ABO/RH: ABO/RH(D): O POS

## 2015-02-20 NOTE — Pre-Procedure Instructions (Signed)
Toni Chan  02/20/2015      [facility] [place], [address] [place] [address] Phone: [phone] Fax: [phone]    Your procedure is scheduled on Wednesday, February 27, 2015 at 12:25 PM.   Report to [facility] Entrance "A" Admitting Office at 10:30 AM.   Call this number if you have problems the morning of surgery:  [phone]              Any questions prior to day of surgery, please call [phone] between 8 & 4 PM.     Remember:  Do not eat food or drink liquids after midnight Tuesday, 02/26/15.  Take these medicines the morning of surgery with A SIP OF WATER: Duloxetine (Cymbalta), Gabapentin (Neurontin), Hydrocodone - if needed, Prilosec - if needed.   Do not wear jewelry, make-up or nail polish.  Do not wear lotions, powders, or perfumes.  You may wear deodorant.  Do not shave 48 hours prior to surgery.    Do not bring valuables to the hospital.  [facility] is not responsible for any belongings or valuables.  Contacts, dentures or bridgework may not be worn into surgery.  Leave your suitcase in the car.  After surgery it may be brought to your room.  For patients admitted to the hospital, discharge time will be determined by your treatment team.  Special instructions:  [place] - Preparing for Surgery  Before surgery, you can play an important role.  Because skin is not sterile, your skin needs to be as free of germs as possible.  You can reduce the number of germs on you skin by washing with CHG (chlorahexidine gluconate) soap before surgery.  CHG is an antiseptic cleaner which kills germs and bonds with the skin to continue killing germs even after washing.  Please DO NOT use if you have an allergy to CHG or antibacterial soaps.  If your skin becomes reddened/irritated stop using the CHG and inform your nurse when you arrive at Short Stay.  Do not shave (including legs and underarms) for at least 48  hours prior to the first CHG shower.  You may shave your face.  Please follow these instructions carefully:   1.  Shower with CHG Soap the night before surgery and the                                morning of Surgery.  2.  If you choose to wash your hair, wash your hair first as usual with your       normal shampoo.  3.  After you shampoo, rinse your hair and body thoroughly to remove the                      Shampoo.  4.  Use CHG as you would any other liquid soap.  You can apply chg directly       to the skin and wash gently with scrungie or a clean washcloth.  5.  Apply the CHG Soap to your body ONLY FROM THE NECK DOWN.        Do not use on open wounds or open sores.  Avoid contact with your eyes, ears, mouth and genitals (private parts).  Wash genitals (private parts) with your normal soap.  6.  Wash thoroughly, paying special attention to the area  where your surgery        will be performed.  7.  Thoroughly rinse your body with warm water from the neck down.  8.  DO NOT shower/wash with your normal soap after using and rinsing off       the CHG Soap.  9.  Pat yourself dry with a clean towel.            10.  Wear clean pajamas.            11.  Place clean sheets on your bed the night of your first shower and do not        sleep with pets.  Day of Surgery  Do not apply any lotions the morning of surgery.  Please wear clean clothes to the hospital.    Please read over the following fact sheets that you were given. Pain Booklet, Coughing and Deep Breathing, Blood Transfusion Information, MRSA Information and Surgical Site Infection Prevention

## 2015-02-20 NOTE — Progress Notes (Signed)
Pt got very tearful when I asked her if she had any feelings of hopelessness and/or feelings of wanting to harm herself. She states she has no feelings of wanting to hurt herself, but she does feel hopeless. States she has not been able to work since 2013 because of her back, she is estranged from her children, is not married. States she has no support system. Has not been able to get disability. Has trouble getting her medications. I called and spoke with the social worker (in the ED) and explained to her about pt and asked if she could come and talk with her. She states the 2nd shift social worker would come and talk with pt.

## 2015-02-20 NOTE — Progress Notes (Signed)
Jodie, SW called and said she could see pt at 4:30 or if pt couldn't stay, she would be glad to call her at home. I spoke with pt and she states she can stay to see the SW. Jodie made aware and will see.

## 2015-02-26 MED ORDER — CEFAZOLIN SODIUM-DEXTROSE 2-3 GM-% IV SOLR
2.0000 g | INTRAVENOUS | Status: AC
  Administered 2015-02-27 (×2): 2 g via INTRAVENOUS
  Filled 2015-02-26: qty 50

## 2015-02-27 ENCOUNTER — Encounter (HOSPITAL_COMMUNITY): Payer: Self-pay | Admitting: Anesthesiology

## 2015-02-27 ENCOUNTER — Inpatient Hospital Stay (HOSPITAL_COMMUNITY): Payer: Medicare Other

## 2015-02-27 ENCOUNTER — Inpatient Hospital Stay (HOSPITAL_COMMUNITY): Payer: Medicare Other | Admitting: Anesthesiology

## 2015-02-27 ENCOUNTER — Encounter (HOSPITAL_COMMUNITY)
Admission: RE | Disposition: A | Discharge status: Skilled Nursing Facility | Payer: Self-pay | Source: Ambulatory Visit | Attending: Orthopaedic Surgery

## 2015-02-27 ENCOUNTER — Inpatient Hospital Stay (HOSPITAL_COMMUNITY)
Admission: RE | Admit: 2015-02-27 | Discharge: 2015-03-04 | DRG: 460 | Disposition: A | Discharge status: Skilled Nursing Facility | Payer: Medicare Other | Source: Ambulatory Visit | Attending: Orthopaedic Surgery | Admitting: Orthopaedic Surgery

## 2015-02-27 DIAGNOSIS — K219 Gastro-esophageal reflux disease without esophagitis: Secondary | ICD-10-CM | POA: Diagnosis present

## 2015-02-27 DIAGNOSIS — T84226A Displacement of internal fixation device of vertebrae, initial encounter: Principal | ICD-10-CM | POA: Diagnosis present

## 2015-02-27 DIAGNOSIS — F17210 Nicotine dependence, cigarettes, uncomplicated: Secondary | ICD-10-CM | POA: Diagnosis present

## 2015-02-27 DIAGNOSIS — Z79899 Other long term (current) drug therapy: Secondary | ICD-10-CM | POA: Diagnosis not present

## 2015-02-27 DIAGNOSIS — F329 Major depressive disorder, single episode, unspecified: Secondary | ICD-10-CM | POA: Diagnosis present

## 2015-02-27 DIAGNOSIS — F419 Anxiety disorder, unspecified: Secondary | ICD-10-CM | POA: Diagnosis present

## 2015-02-27 DIAGNOSIS — G8929 Other chronic pain: Secondary | ICD-10-CM | POA: Diagnosis present

## 2015-02-27 DIAGNOSIS — Z79891 Long term (current) use of opiate analgesic: Secondary | ICD-10-CM

## 2015-02-27 DIAGNOSIS — M549 Dorsalgia, unspecified: Secondary | ICD-10-CM | POA: Diagnosis present

## 2015-02-27 DIAGNOSIS — Z981 Arthrodesis status: Secondary | ICD-10-CM

## 2015-02-27 DIAGNOSIS — Z8614 Personal history of Methicillin resistant Staphylococcus aureus infection: Secondary | ICD-10-CM | POA: Diagnosis not present

## 2015-02-27 DIAGNOSIS — M79605 Pain in left leg: Secondary | ICD-10-CM | POA: Diagnosis not present

## 2015-02-27 DIAGNOSIS — Y838 Other surgical procedures as the cause of abnormal reaction of the patient, or of later complication, without mention of misadventure at the time of the procedure: Secondary | ICD-10-CM | POA: Diagnosis present

## 2015-02-27 DIAGNOSIS — M960 Pseudarthrosis after fusion or arthrodesis: Secondary | ICD-10-CM | POA: Diagnosis present

## 2015-02-27 DIAGNOSIS — Z419 Encounter for procedure for purposes other than remedying health state, unspecified: Secondary | ICD-10-CM

## 2015-02-27 HISTORY — PX: LUMBAR FUSION: SHX111

## 2015-02-27 SURGERY — POSTERIOR LUMBAR FUSION 1 WITH HARDWARE REMOVAL
Anesthesia: General

## 2015-02-27 MED ORDER — GLYCOPYRROLATE 0.2 MG/ML IJ SOLN
INTRAMUSCULAR | Status: DC | PRN
  Administered 2015-02-27: .6 mg via INTRAVENOUS

## 2015-02-27 MED ORDER — FENTANYL CITRATE (PF) 250 MCG/5ML IJ SOLN
INTRAMUSCULAR | Status: AC
  Filled 2015-02-27: qty 5

## 2015-02-27 MED ORDER — MENTHOL 3 MG MT LOZG: 1.0000 | LOZENGE | OROMUCOSAL | Status: DC | PRN

## 2015-02-27 MED ORDER — ROCURONIUM BROMIDE 50 MG/5ML IV SOLN
INTRAVENOUS | Status: AC
  Filled 2015-02-27: qty 1

## 2015-02-27 MED ORDER — 0.9 % SODIUM CHLORIDE (POUR BTL) OPTIME
TOPICAL | Status: DC | PRN
Start: 2015-02-27 — End: 2015-02-27
  Administered 2015-02-27: 1000 mL

## 2015-02-27 MED ORDER — PHENOL 1.4 % MT LIQD: 1.0000 | OROMUCOSAL | Status: DC | PRN

## 2015-02-27 MED ORDER — HYDROMORPHONE HCL 1 MG/ML IJ SOLN
0.5000 mg | INTRAMUSCULAR | Status: AC | PRN
  Administered 2015-02-27 (×6): 0.5 mg via INTRAVENOUS

## 2015-02-27 MED ORDER — MIDAZOLAM HCL 5 MG/5ML IJ SOLN
INTRAMUSCULAR | Status: DC | PRN
  Administered 2015-02-27: 2 mg via INTRAVENOUS

## 2015-02-27 MED ORDER — NEOSTIGMINE METHYLSULFATE 10 MG/10ML IV SOLN
INTRAVENOUS | Status: AC
  Filled 2015-02-27: qty 1

## 2015-02-27 MED ORDER — THROMBIN 5000 UNITS EX SOLR
CUTANEOUS | Status: AC
  Filled 2015-02-27: qty 5000

## 2015-02-27 MED ORDER — OXYCODONE-ACETAMINOPHEN 5-325 MG PO TABS
1.0000 | ORAL_TABLET | ORAL | Status: DC | PRN
  Administered 2015-02-27 – 2015-03-01 (×6): 2 via ORAL
  Filled 2015-02-27 (×6): qty 2

## 2015-02-27 MED ORDER — PROPOFOL 10 MG/ML IV BOLUS
INTRAVENOUS | Status: DC | PRN
  Administered 2015-02-27: 140 mg via INTRAVENOUS

## 2015-02-27 MED ORDER — HEMOSTATIC AGENTS (NO CHARGE) OPTIME
TOPICAL | Status: DC | PRN
Start: 2015-02-27 — End: 2015-02-27
  Administered 2015-02-27: 1 via TOPICAL

## 2015-02-27 MED ORDER — CHLORHEXIDINE GLUCONATE 4 % EX LIQD: 60.0000 mL | Freq: Once | CUTANEOUS | Status: DC

## 2015-02-27 MED ORDER — HYDROMORPHONE HCL 1 MG/ML IJ SOLN
INTRAMUSCULAR | Status: AC
  Filled 2015-02-27: qty 1

## 2015-02-27 MED ORDER — METHOCARBAMOL 1000 MG/10ML IJ SOLN
500.0000 mg | Freq: Four times a day (QID) | INTRAMUSCULAR | Status: DC | PRN
  Filled 2015-02-27: qty 5

## 2015-02-27 MED ORDER — SODIUM CHLORIDE 0.9 % IJ SOLN: 3.0000 mL | INTRAMUSCULAR | Status: DC | PRN

## 2015-02-27 MED ORDER — ONDANSETRON HCL 4 MG/2ML IJ SOLN: 4.0000 mg | INTRAMUSCULAR | Status: DC | PRN

## 2015-02-27 MED ORDER — METHOCARBAMOL 500 MG PO TABS
500.0000 mg | ORAL_TABLET | Freq: Four times a day (QID) | ORAL | Status: DC | PRN
  Administered 2015-02-27 – 2015-03-04 (×13): 500 mg via ORAL
  Filled 2015-02-27 (×14): qty 1

## 2015-02-27 MED ORDER — GLYCOPYRROLATE 0.2 MG/ML IJ SOLN
INTRAMUSCULAR | Status: AC
  Filled 2015-02-27: qty 2

## 2015-02-27 MED ORDER — PROPOFOL 10 MG/ML IV BOLUS
INTRAVENOUS | Status: AC
  Filled 2015-02-27: qty 20

## 2015-02-27 MED ORDER — ONDANSETRON HCL 4 MG/2ML IJ SOLN
4.0000 mg | Freq: Once | INTRAMUSCULAR | Status: DC | PRN
Start: 2015-02-27 — End: 2015-02-27

## 2015-02-27 MED ORDER — ACETAMINOPHEN 325 MG PO TABS: 650.0000 mg | ORAL_TABLET | ORAL | Status: DC | PRN

## 2015-02-27 MED ORDER — FENTANYL CITRATE (PF) 100 MCG/2ML IJ SOLN
INTRAMUSCULAR | Status: DC | PRN
  Administered 2015-02-27: 150 ug via INTRAVENOUS
  Administered 2015-02-27 (×4): 50 ug via INTRAVENOUS

## 2015-02-27 MED ORDER — LACTATED RINGERS IV SOLN
INTRAVENOUS | Status: DC
  Administered 2015-02-27 – 2015-02-28 (×5): via INTRAVENOUS

## 2015-02-27 MED ORDER — BUPIVACAINE HCL (PF) 0.25 % IJ SOLN
INTRAMUSCULAR | Status: AC
  Filled 2015-02-27: qty 30

## 2015-02-27 MED ORDER — OXYCODONE-ACETAMINOPHEN 5-325 MG PO TABS
ORAL_TABLET | ORAL | Status: AC
  Filled 2015-02-27: qty 2

## 2015-02-27 MED ORDER — MIDAZOLAM HCL 2 MG/2ML IJ SOLN
INTRAMUSCULAR | Status: AC
  Filled 2015-02-27: qty 2

## 2015-02-27 MED ORDER — SODIUM CHLORIDE 0.9 % IJ SOLN
3.0000 mL | Freq: Two times a day (BID) | INTRAMUSCULAR | Status: DC
  Administered 2015-02-27 – 2015-03-03 (×2): 3 mL via INTRAVENOUS

## 2015-02-27 MED ORDER — ONDANSETRON HCL 4 MG/2ML IJ SOLN
INTRAMUSCULAR | Status: AC
  Filled 2015-02-27: qty 2

## 2015-02-27 MED ORDER — EPHEDRINE SULFATE 50 MG/ML IJ SOLN
INTRAMUSCULAR | Status: DC | PRN
  Administered 2015-02-27: 10 mg via INTRAVENOUS

## 2015-02-27 MED ORDER — EPHEDRINE SULFATE 50 MG/ML IJ SOLN
INTRAMUSCULAR | Status: AC
  Filled 2015-02-27: qty 1

## 2015-02-27 MED ORDER — FENTANYL CITRATE (PF) 100 MCG/2ML IJ SOLN
100.0000 ug | Freq: Once | INTRAMUSCULAR | Status: AC
  Administered 2015-02-27: 100 ug via INTRAVENOUS

## 2015-02-27 MED ORDER — ONDANSETRON HCL 4 MG/2ML IJ SOLN
INTRAMUSCULAR | Status: DC | PRN
  Administered 2015-02-27: 4 mg via INTRAVENOUS

## 2015-02-27 MED ORDER — METHOCARBAMOL 500 MG PO TABS
ORAL_TABLET | ORAL | Status: AC
  Filled 2015-02-27: qty 1

## 2015-02-27 MED ORDER — SODIUM CHLORIDE 0.9 % IV SOLN: 250.0000 mL | INTRAVENOUS | Status: DC

## 2015-02-27 MED ORDER — HYDROMORPHONE HCL 1 MG/ML IJ SOLN
1.0000 mg | INTRAMUSCULAR | Status: DC | PRN
  Administered 2015-02-27 – 2015-03-04 (×20): 1 mg via INTRAVENOUS
  Filled 2015-02-27 (×20): qty 1

## 2015-02-27 MED ORDER — ACETAMINOPHEN 650 MG RE SUPP: 650.0000 mg | RECTAL | Status: DC | PRN

## 2015-02-27 MED ORDER — POTASSIUM CHLORIDE IN NACL 20-0.45 MEQ/L-% IV SOLN
INTRAVENOUS | Status: DC
  Administered 2015-02-28: 08:00:00 via INTRAVENOUS
  Filled 2015-02-27 (×12): qty 1000

## 2015-02-27 MED ORDER — NEOSTIGMINE METHYLSULFATE 10 MG/10ML IV SOLN
INTRAVENOUS | Status: DC | PRN
  Administered 2015-02-27: 3 mg via INTRAVENOUS

## 2015-02-27 MED ORDER — PANTOPRAZOLE SODIUM 40 MG PO TBEC
40.0000 mg | DELAYED_RELEASE_TABLET | Freq: Every day | ORAL | Status: DC
  Administered 2015-02-28 – 2015-03-04 (×5): 40 mg via ORAL
  Filled 2015-02-27 (×5): qty 1

## 2015-02-27 MED ORDER — DULOXETINE HCL 30 MG PO CPEP
30.0000 mg | ORAL_CAPSULE | Freq: Every day | ORAL | Status: DC
  Administered 2015-02-28 – 2015-03-02 (×3): 30 mg via ORAL
  Filled 2015-02-27 (×3): qty 1

## 2015-02-27 MED ORDER — ROCURONIUM BROMIDE 100 MG/10ML IV SOLN
INTRAVENOUS | Status: DC | PRN
  Administered 2015-02-27: 10 mg via INTRAVENOUS
  Administered 2015-02-27: 40 mg via INTRAVENOUS

## 2015-02-27 MED ORDER — LIDOCAINE HCL (CARDIAC) 20 MG/ML IV SOLN
INTRAVENOUS | Status: AC
  Filled 2015-02-27: qty 5

## 2015-02-27 MED ORDER — PHENYLEPHRINE 40 MCG/ML (10ML) SYRINGE FOR IV PUSH (FOR BLOOD PRESSURE SUPPORT)
PREFILLED_SYRINGE | INTRAVENOUS | Status: AC
  Filled 2015-02-27: qty 10

## 2015-02-27 MED ORDER — BUPIVACAINE HCL (PF) 0.25 % IJ SOLN
INTRAMUSCULAR | Status: DC | PRN
  Administered 2015-02-27: 20 mL

## 2015-02-27 MED ORDER — GABAPENTIN 300 MG PO CAPS
600.0000 mg | ORAL_CAPSULE | Freq: Three times a day (TID) | ORAL | Status: DC
  Administered 2015-02-27 – 2015-03-04 (×13): 600 mg via ORAL
  Filled 2015-02-27 (×14): qty 2

## 2015-02-27 MED ORDER — FENTANYL CITRATE (PF) 100 MCG/2ML IJ SOLN
INTRAMUSCULAR | Status: AC
  Filled 2015-02-27: qty 2

## 2015-02-27 SURGICAL SUPPLY — 73 items
BANDAGE ELASTIC 6 VELCRO ST LF (GAUZE/BANDAGES/DRESSINGS) IMPLANT
BLADE SURG 10 STRL SS (BLADE) ×2 IMPLANT
BLADE SURG ROTATE 9660 (MISCELLANEOUS) IMPLANT
BUR ROUND FLUTED 4 SOFT TCH (BURR) ×2 IMPLANT
CAP SPINAL LOCKING TI (Cap) ×12 IMPLANT
CLSR STERI-STRIP ANTIMIC 1/2X4 (GAUZE/BANDAGES/DRESSINGS) ×2 IMPLANT
CORDS BIPOLAR (ELECTRODE) ×2 IMPLANT
COVER SURGICAL LIGHT HANDLE (MISCELLANEOUS) ×2 IMPLANT
DERMABOND ADVANCED (GAUZE/BANDAGES/DRESSINGS) ×1
DERMABOND ADVANCED .7 DNX12 (GAUZE/BANDAGES/DRESSINGS) ×1 IMPLANT
DRAPE C-ARM 42X72 X-RAY (DRAPES) ×2 IMPLANT
DRAPE C-ARMOR (DRAPES) ×2 IMPLANT
DRAPE MICROSCOPE LEICA (MISCELLANEOUS) IMPLANT
DRAPE ORTHO SPLIT 77X108 STRL (DRAPES) ×1
DRAPE PROXIMA HALF (DRAPES) ×8 IMPLANT
DRAPE SURG 17X23 STRL (DRAPES) ×6 IMPLANT
DRAPE SURG ORHT 6 SPLT 77X108 (DRAPES) ×1 IMPLANT
DRAPE TABLE COVER HEAVY DUTY (DRAPES) ×2 IMPLANT
DRSG EMULSION OIL 3X3 NADH (GAUZE/BANDAGES/DRESSINGS) ×2 IMPLANT
DRSG MEPILEX BORDER 4X4 (GAUZE/BANDAGES/DRESSINGS) ×2 IMPLANT
DRSG MEPILEX BORDER 4X8 (GAUZE/BANDAGES/DRESSINGS) ×2 IMPLANT
DRSG PAD ABDOMINAL 8X10 ST (GAUZE/BANDAGES/DRESSINGS) ×4 IMPLANT
DURAPREP 26ML APPLICATOR (WOUND CARE) ×2 IMPLANT
ELECT CAUTERY BLADE 6.4 (BLADE) ×2 IMPLANT
ELECT REM PT RETURN 9FT ADLT (ELECTROSURGICAL) ×2
ELECTRODE REM PT RTRN 9FT ADLT (ELECTROSURGICAL) ×1 IMPLANT
EVACUATOR 1/8 PVC DRAIN (DRAIN) ×2 IMPLANT
GLOVE BIOGEL PI IND STRL 6.5 (GLOVE) ×1 IMPLANT
GLOVE BIOGEL PI IND STRL 8 (GLOVE) ×2 IMPLANT
GLOVE BIOGEL PI INDICATOR 6.5 (GLOVE) ×1
GLOVE BIOGEL PI INDICATOR 8 (GLOVE) ×2
GLOVE ORTHO TXT STRL SZ7.5 (GLOVE) ×4 IMPLANT
GLOVE SURG SS PI 6.0 STRL IVOR (GLOVE) ×2 IMPLANT
GOWN STRL REUS W/ TWL LRG LVL3 (GOWN DISPOSABLE) ×1 IMPLANT
GOWN STRL REUS W/ TWL XL LVL3 (GOWN DISPOSABLE) ×1 IMPLANT
GOWN STRL REUS W/TWL 2XL LVL3 (GOWN DISPOSABLE) ×2 IMPLANT
GOWN STRL REUS W/TWL LRG LVL3 (GOWN DISPOSABLE) ×1
GOWN STRL REUS W/TWL XL LVL3 (GOWN DISPOSABLE) ×1
HEMOSTAT SURGICEL 2X14 (HEMOSTASIS) IMPLANT
KIT BASIN OR (CUSTOM PROCEDURE TRAY) ×2 IMPLANT
KIT ROOM TURNOVER OR (KITS) ×2 IMPLANT
MANIFOLD NEPTUNE II (INSTRUMENTS) ×2 IMPLANT
MIX DBX 10CC 35% BONE (Bone Implant) ×2 IMPLANT
NDL SUT .5 MAYO 1.404X.05X (NEEDLE) ×1 IMPLANT
NEEDLE HYPO 25X1 1.5 SAFETY (NEEDLE) ×2 IMPLANT
NEEDLE MAYO TAPER (NEEDLE) ×1
NEEDLE SPNL 18GX3.5 QUINCKE PK (NEEDLE) ×6 IMPLANT
NS IRRIG 1000ML POUR BTL (IV SOLUTION) ×2 IMPLANT
PACK LAMINECTOMY ORTHO (CUSTOM PROCEDURE TRAY) ×2 IMPLANT
PAD ARMBOARD 7.5X6 YLW CONV (MISCELLANEOUS) ×4 IMPLANT
PATTIES SURGICAL .5 X.5 (GAUZE/BANDAGES/DRESSINGS) ×4 IMPLANT
PATTIES SURGICAL .75X.75 (GAUZE/BANDAGES/DRESSINGS) IMPLANT
ROD 40MM (Rod) ×2 IMPLANT
ROD SPNL CVD 40X5.5XHRD NS (Rod) ×2 IMPLANT
SCREW 7.0X45MM (Screw) ×6 IMPLANT
SCREW MATRIX MIS 7.0X30MM (Screw) ×2 IMPLANT
SCREW MATRIX MIS 7.0X45MM (Screw) ×4 IMPLANT
SPONGE LAP 18X18 X RAY DECT (DISPOSABLE) IMPLANT
SPONGE LAP 4X18 X RAY DECT (DISPOSABLE) IMPLANT
SPONGE SURGIFOAM ABS GEL SZ50 (HEMOSTASIS) ×2 IMPLANT
STAPLER VISISTAT 35W (STAPLE) IMPLANT
SURGIFLO TRUKIT (HEMOSTASIS) ×2 IMPLANT
SUT BONE WAX W31G (SUTURE) ×2 IMPLANT
SUT VIC AB 2-0 CT1 27 (SUTURE) ×1
SUT VIC AB 2-0 CT1 TAPERPNT 27 (SUTURE) ×1 IMPLANT
SUT VICRYL 0 TIES 12 18 (SUTURE) ×2 IMPLANT
SUT VICRYL 4-0 PS2 18IN ABS (SUTURE) IMPLANT
SUT VICRYL AB 2 0 TIES (SUTURE) ×2 IMPLANT
TOWEL OR 17X24 6PK STRL BLUE (TOWEL DISPOSABLE) ×2 IMPLANT
TOWEL OR 17X26 10 PK STRL BLUE (TOWEL DISPOSABLE) ×2 IMPLANT
TRAY FOLEY CATH 16FRSI W/METER (SET/KITS/TRAYS/PACK) ×2 IMPLANT
WATER STERILE IRR 1000ML POUR (IV SOLUTION) ×2 IMPLANT
YANKAUER SUCT BULB TIP NO VENT (SUCTIONS) ×2 IMPLANT

## 2015-02-27 NOTE — Interval H&P Note (Signed)
History and Physical Interval Note:  02/27/2015 12:33 PM  Toni Chan  has presented today for surgery, with the diagnosis of L5-S1 Pseudarthrosis, Hardware Malposition  The various methods of treatment have been discussed with the patient and family. After consideration of risks, benefits and other options for treatment, the patient has consented to  Procedure(s): L5-S1 TLIF (transforaminal lumbar interbody fusion) Revision, Pedicle Instrumentation Removal, Possible New Instrumentation, Bilateral Fusion (N/A) as a surgical intervention .  The patient's history has been reviewed, patient examined, no change in status, stable for surgery.  I have reviewed the patient's chart and labs.  Questions were answered to the patient's satisfaction.     Jacoby Zanni C

## 2015-02-27 NOTE — Brief Op Note (Signed)
02/27/2015  5:08 PM  PATIENT:  Toni Chan  49 y.o. female  PRE-OPERATIVE DIAGNOSIS:  L5-S1 Pseudarthrosis, Hardware Malposition  POST-OPERATIVE DIAGNOSIS:  L5-S1 Pseudarthrosis, Hardware Malposition  PROCEDURE:  Procedure(s): L5-S1 TLIF (transforaminal lumbar interbody fusion) Revision, Pedicle Instrumentation Removal, Placement  New Instrumentation, Bilateral Fusion (N/A)  SURGEON:  Surgeon(s) and Role:    * Marybelle Killings, MD - Primary  PHYSICIAN ASSISTANT: Benjiman Core pa-c   ANESTHESIA:   general  EBL:  Total I/O In: 2000 [I.V.:2000] Out: 710 [Urine:410; Blood:300]  BLOOD ADMINISTERED:none  DRAINS: none   LOCAL MEDICATIONS USED:  MARCAINE     SPECIMEN:  No Specimen  DISPOSITION OF SPECIMEN:  N/A  COUNTS:  YES  TOURNIQUET:  * No tourniquets in log *    PATIENT DISPOSITION:  PACU - hemodynamically stable.

## 2015-02-27 NOTE — H&P (Signed)
Toni Chan is an 49 y.o. female.   Chief Complaint: chronic back pain, leg pain/weakness  HPI:  Patient presented to our office with above complaint.  Progressively worsening symptoms since lumbar fusion in [place].  Symptoms due to pseudoarthrosis and malpositioned pedicle screw confirmed by CT scan 18 January 2015  Past Medical History  Diagnosis Date  . IBS (irritable bowel syndrome)   . Chronic back pain   . Cervical cancer 1989    Vaginal hysterectomy and oophorectomy 1993  . Kidney stones 2014  . Renal cyst 2014  . Endometriosis   . GERD (gastroesophageal reflux disease)   . Superficial thrombophlebitis   . Placental abruption   . Pilonidal cyst 2015  . Pneumonia   . Anxiety   . Depression   . Arthritis   . Hx MRSA infection 2014    in the area of the scar fr. having coccyx removal    Past Surgical History  Procedure Laterality Date  . Coccyx removal  2014  . Cesarean section  1992  . Back surgery  2013, 2014 X2  . Abdominal hysterectomy      laparoscopic  . Tonsillectomy    . Colonoscopy      Family History  Problem Relation Age of Onset  . Depression Mother   . Alcohol abuse Father   . Heart disease Father   . Kidney disease Father   . Alcohol abuse Brother   . Heart disease Brother   . Drug abuse Brother    Social History:  reports that she has been smoking Cigarettes.  She has been smoking about 0.50 packs per day. She has never used smokeless tobacco. She reports that she does not drink alcohol or use illicit drugs.  Allergies: No Known Allergies  Medications Prior to Admission  Medication Sig Dispense Refill  . DULoxetine (CYMBALTA) 60 MG capsule Take 1 capsule (60 mg total) by mouth daily. 30 capsule 2  . gabapentin (NEURONTIN) 300 MG capsule Take 2 capsules (600 mg total) by mouth 3 (three) times daily. 180 capsule 1  . HYDROcodone-acetaminophen (NORCO) 10-325 MG per tablet Take 1 tablet by mouth every 6 (six) hours as needed. (Patient taking  differently: Take 1 tablet by mouth every 6 (six) hours as needed for moderate pain. ) 60 tablet 0  . omeprazole (PRILOSEC) 20 MG capsule Take 1 capsule (20 mg total) by mouth daily. (Patient taking differently: Take 20 mg by mouth daily as needed (patient does not take on a daily basis, only takes when needed.). ) 30 capsule 2  . pregabalin (LYRICA) 75 MG capsule Take 1 capsule (75 mg total) by mouth 2 (two) times daily. (Patient taking differently: Take 75 mg by mouth 2 (two) times daily. Patient has prescription at pharmacy, can not afford. But is trying to get help from [facility].) 180 capsule 0    No results found for this or any previous visit (from the past 48 hour(s)). No results found.  Review of Systems  Unable to perform ROS   Blood pressure 118/61, pulse 69, temperature 98 F (36.7 C), temperature source Oral, resp. rate 8, height 5\' 2"  (1.575 m), weight 69.536 kg (153 lb 4.8 oz), SpO2 95 %. Physical Exam  Alert and oriented.  Significant sciatic notch tenderness on right.  Positive right SLR.  Trace ankle jerk. Knee jerk is 2+ Assessment/Plan Pseudoarthrosis and malpositioned pedicle screw.  Will proceed with surgery as discussed.  Procedure along with possible risks and complications discussed and all questions  answered.    Shell Blanchette M 02/27/2015, 12:26 PM

## 2015-02-27 NOTE — Transfer of Care (Signed)
Immediate Anesthesia Transfer of Care Note  Patient: Toni Chan  Procedure(s) Performed: Procedure(s): L5-S1 TLIF (transforaminal lumbar interbody fusion) Revision, Pedicle Instrumentation Removal, Placement  New Instrumentation, Bilateral Fusion (N/A)  Patient Location: PACU  Anesthesia Type:General  Level of Consciousness: awake, alert  and oriented  Airway & Oxygen Therapy: Patient Spontanous Breathing and Patient connected to nasal cannula oxygen  Post-op Assessment: Report given to RN and Post -op Vital signs reviewed and stable  Post vital signs: Reviewed and stable  Last Vitals:  Filed Vitals:   02/27/15 1715  BP: 132/68  Pulse: 66  Temp: 36.9 C  Resp: 7    Complications: No apparent anesthesia complications

## 2015-02-27 NOTE — Anesthesia Preprocedure Evaluation (Addendum)
Anesthesia Evaluation  Patient identified by MRN, date of birth, ID band Patient awake    Reviewed: Allergy & Precautions, NPO status , Patient's Chart, lab work & pertinent test results  Airway Mallampati: I       Dental   Pulmonary Current Smoker,    Pulmonary exam normal       Cardiovascular Normal cardiovascular exam    Neuro/Psych Anxiety Depression    GI/Hepatic GERD-  ,  Endo/Other    Renal/GU Renal disease     Musculoskeletal  (+) Arthritis -,   Abdominal   Peds  Hematology   Anesthesia Other Findings   Reproductive/Obstetrics                            Anesthesia Physical Anesthesia Plan  ASA: I  Anesthesia Plan: General   Post-op Pain Management:    Induction: Intravenous  Airway Management Planned: Oral ETT  Additional Equipment:   Intra-op Plan:   Post-operative Plan: Extubation in OR  Informed Consent: I have reviewed the patients History and Physical, chart, labs and discussed the procedure including the risks, benefits and alternatives for the proposed anesthesia with the patient or authorized representative who has indicated his/her understanding and acceptance.     Plan Discussed with: CRNA, Anesthesiologist and Surgeon  Anesthesia Plan Comments:         Anesthesia Quick Evaluation

## 2015-02-27 NOTE — Progress Notes (Signed)
Call to Dr. Redmond School, reported pt.'s pain concerns , scoring at 10.  New order obtained

## 2015-02-27 NOTE — Anesthesia Procedure Notes (Signed)
Procedure Name: Intubation Date/Time: 02/27/2015 1:22 PM Performed by: Eligha Bridegroom Pre-anesthesia Checklist: Emergency Drugs available, Patient identified, Timeout performed, Suction available and Patient being monitored Patient Re-evaluated:Patient Re-evaluated prior to inductionOxygen Delivery Method: Circle system utilized Preoxygenation: Pre-oxygenation with 100% oxygen Intubation Type: IV induction Ventilation: Mask ventilation without difficulty and Oral airway inserted - appropriate to patient size Laryngoscope Size: Mac and 3 Grade View: Grade II Tube type: Oral Tube size: 7.0 mm Number of attempts: 1 Airway Equipment and Method: Stylet and LTA kit utilized Placement Confirmation: ETT inserted through vocal cords under direct vision,  breath sounds checked- equal and bilateral and positive ETCO2 Secured at: 21 cm Dental Injury: Teeth and Oropharynx as per pre-operative assessment

## 2015-02-28 ENCOUNTER — Encounter (HOSPITAL_COMMUNITY): Payer: Self-pay | Admitting: General Practice

## 2015-02-28 LAB — COMPREHENSIVE METABOLIC PANEL
ALBUMIN: 2.5 g/dL — AB (ref 3.5–5.0)
ALK PHOS: 52 U/L (ref 38–126)
ALT: 17 U/L (ref 14–54)
AST: 29 U/L (ref 15–41)
Anion gap: 5 (ref 5–15)
BILIRUBIN TOTAL: 0.3 mg/dL (ref 0.3–1.2)
BUN: <5 mg/dL — ABNORMAL LOW (ref 6–20)
CO2: 27 mmol/L (ref 22–32)
CREATININE: 0.8 mg/dL (ref 0.44–1.00)
Calcium: 7.7 mg/dL — ABNORMAL LOW (ref 8.9–10.3)
Chloride: 106 mmol/L (ref 101–111)
GFR calc Af Amer: >60 mL/min (ref >60)
GFR calc non Af Amer: >60 mL/min (ref >60)
GLUCOSE: 143 mg/dL — AB (ref 65–99)
POTASSIUM: 3.7 mmol/L (ref 3.5–5.1)
Sodium: 138 mmol/L (ref 135–145)
TOTAL PROTEIN: 4.8 g/dL — AB (ref 6.5–8.1)

## 2015-02-28 LAB — CBC
HCT: 28.9 % — ABNORMAL LOW (ref 36.0–46.0)
Hemoglobin: 9.3 g/dL — ABNORMAL LOW (ref 12.0–15.0)
MCH: 30.4 pg (ref 26.0–34.0)
MCHC: 32.2 g/dL (ref 30.0–36.0)
MCV: 94.4 fL (ref 78.0–100.0)
Platelets: 165 10*3/uL (ref 150–400)
RBC: 3.06 MIL/uL — ABNORMAL LOW (ref 3.87–5.11)
RDW: 12.9 % (ref 11.5–15.5)
WBC: 8.9 10*3/uL (ref 4.0–10.5)

## 2015-02-28 LAB — BASIC METABOLIC PANEL
Anion gap: 6 (ref 5–15)
BUN: <5 mg/dL — ABNORMAL LOW (ref 6–20)
CALCIUM: 7.8 mg/dL — AB (ref 8.9–10.3)
CO2: 26 mmol/L (ref 22–32)
Chloride: 104 mmol/L (ref 101–111)
Creatinine, Ser: 0.72 mg/dL (ref 0.44–1.00)
Glucose, Bld: 108 mg/dL — ABNORMAL HIGH (ref 65–99)
Potassium: 3.7 mmol/L (ref 3.5–5.1)
Sodium: 136 mmol/L (ref 135–145)

## 2015-02-28 LAB — MAGNESIUM: Magnesium: 1.8 mg/dL (ref 1.7–2.4)

## 2015-02-28 NOTE — Progress Notes (Signed)
Orders request one time OOB with brace, stand, walk - assess effects on HA +/or back pain. While sitting EOB and applying brace with PT and ortho tech [who delivered back brace/ adjusted] w/o significant back discomfort or HA. Once stood - c/o severe HA. BP elevated @ 150/109. Telephone update to Dr. Lorin Mercy - informed of above. Ordered to remain bed rest, OOB flat, log roll for position changes and to keep Foley catheter in "for now." Stated he would make rounds first thing in the AM to reassess. This RN entered above orders and updated patient.

## 2015-02-28 NOTE — Progress Notes (Signed)
Orthopedic Tech Progress Note Patient Details:  Toni Chan [birth year] [id number] Brace order completed by bio-tech vendor. Patient ID: [name], female   DOB: 1966/07/02, 49 y.o.   MRN: [id number]   Braulio Bosch 02/28/2015, 4:33 PM

## 2015-02-28 NOTE — Op Note (Signed)
NAMEVIRJEAN, Toni Chan NO.:  [id number]  MEDICAL RECORD NO.:  [id number]  LOCATION:  5N07C                        FACILITY:  [facility]  PHYSICIAN:  Masiel Gentzler C. Lorin Mercy, M.D.    DATE OF BIRTH:  10/06/1965  DATE OF PROCEDURE:  02/27/2015 DATE OF DISCHARGE:                              OPERATIVE REPORT   SURGEON:  Chaise Passarella C. Lorin Mercy, M.D.  ASSISTANT:  Alyson Locket. Ricard Dillon, PA-C, medically necessary and present for the entire procedure.  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  PROCEDURES:  Exploration of lumbar fusion, removal of Medtronic Sextant pedicle instrumentation L5-S1.  Removal of screw in right S1 nerve root. Bilateral lateral gutter fusion with placement of Synthes DePuy Matrix screws 7 x 45 screws in L5 and 7 x 35 mm screws in S1.  Bilateral 40 mm rods. Anesthesia:  General  BRIEF HISTORY:  A 49 year old female, she had some surgeries done at [facility] and had fixation with persistent problems with right S1 nerve root pain and weakness.  X-ray suggested pseudarthrosis. There was concern about the right S1 screw and myelogram CT scan was performed, which showed right S1 screw went through the nerve root or partially through the nerve root of S1 in the lateral recess.  There was no evidence of effusion and due to the persistent problem, the patient had not responded to conservative treatment and revision surgery with fixation of the malposition screw was indicated.  DESCRIPTION OF PROCEDURE:  After induction of general anesthesia, the patient was placed on the spine frame.  Back was prepped with DuraPrep. Foley catheter had been placed for the patient flipped over, calf bumpers, preoperative Ancef prophylaxis, DuraPrep after the area  squared with 10/15 and 10/10 drapes.  Area was squared with towels, Betadine, Steri-Drape applied.  After sterile skin marker was used for the old incision, the patient had percutaneous placement over pedicle screws. Pedicle screws at L5 were in good position and one on the left was in good position, however the right was too far medial, one in the lateral recess, and screw was located directly where the nerve root would normally be situated.  Time-out procedure was completed.  Midline incision was made.  Dissection out to both pedicle instrumentation and hardware with extreme care taken on the right side, not used the Bovie electrocautery for fair further damage to the nerve root.  The right S1 screw was not in solid fixation posteriorly.  Left screws were removed. Transverse processes were prepared, decorticated.  The spinous processes were taken.  Lamina was left since the patient did not have residual stenosis.  Screws removed on the right side and with removal of the right S1 screw, there was no spinal fluid leak through the hole.  These screws were placed as listed above.  A 40 mm rods were placed for the right S1 screw going further lateral, so that the screw was in the pedicle.  Initially checking, it looked like it was angled cephalad, was redirected, more caudad with convergence and  good position and direct look was taken with fluoroscopy directly down the pedicle make sure it was in good shape.  There was just possibly one thread through the anterior cortex at the sacrum.  A 40 mm rods were placed, tightened, compressed, locked down, and there was good compression.  Once the old hardware had been removed, spinous processes were grasped before they were harvested for bone graft and lamina had been thin some for bone graft and there was obvious motion at L5-S1.  Once it was compressed, no motion was present.  Bone graft was mixed with 10 mL of DBX made by MTF and this was mixed  with the patient's own bone and placed out over the decorticated lateral transverse processes at L5 and over the sacral ala at S1.  Copious irrigation.  Closure of the fascia with #1 Vicryl, 2-0 Vicryl, subcutaneous tissue, skin closure, postop dressing, Dermabond in the skin and transferred to the recovery room.  Instrument count and needle count was correct.     Lenzie Montesano C. Lorin Mercy, M.D.     MCY/MEDQ  D:  02/27/2015  T:  02/28/2015  Job:  [id number]

## 2015-02-28 NOTE — Progress Notes (Signed)
Subjective: C/o some "bad headache", lightheadedness, photophobia, some nausea.  Back pain controlled and  preop left leg pain much better.     Objective: Vital signs in last 24 hours: Temp:  [97.5 F (36.4 C)-99.1 F (37.3 C)] 98 F (36.7 C) (06/02 0523) Pulse Rate:  [65-95] 80 (06/02 0523) Resp:  [7-20] 16 (06/02 0523) BP: (85-132)/(41-79) 107/56 mmHg (06/02 0523) SpO2:  [95 %-100 %] 99 % (06/02 0523) Weight:  [69.536 kg (153 lb 4.8 oz)] 69.536 kg (153 lb 4.8 oz) (06/01 1022)  Intake/Output from previous day: 06/01 0701 - 06/02 0700 In: 3736.7 [P.O.:480; I.V.:3256.7] Out: 2385 [Urine:2085; Blood:300] Intake/Output this shift:     Recent Labs  02/28/15 0508  HGB 9.3*    Recent Labs  02/28/15 0508  WBC 8.9  RBC 3.06*  HCT 28.9*  PLT 165    Recent Labs  02/28/15 0508  NA 136  K 3.7  CL 104  CO2 26  BUN <5*  CREATININE 0.72  GLUCOSE 108*  CALCIUM 7.8*   No results for input(s): LABPT, INR in the last 72 hours.  Exam:  Alert and oriented.  Neurologically intact.  bilat calves nontender.   Assessment/Plan: Continue bed rest for now.  Will see if headache resolves.  Waiting for lumbar brace.     Lee Kalt M 02/28/2015, 8:39 AM

## 2015-02-28 NOTE — Evaluation (Signed)
Physical Therapy Evaluation Patient Details Name: Toni Chan MRN: [id number] DOB: 1965/10/06 Today's Date: 02/28/2015   History of Present Illness  Pt is a 49 y/o F s/p L5-S1 TLIF revision, pedicle instrumentation removal, placement of new instrumentation, B fusion.  Pt's PMH includes lumbar fusion, anxiety, depression.  Clinical Impression  Patient is s/p above surgery resulting in functional limitations due to the deficits listed below (see PT Problem List). Pt limited by pain in low back and headache that worsened w/ standing EOB.  Pt will not have assist at home upon d/c and will benefit from [facility] to return to independent level prior to returning home.  Patient will benefit from skilled PT to increase their independence and safety with mobility to allow discharge to the venue listed below.      Follow Up Recommendations SNF ([facility])    Equipment Recommendations  Rolling walker with 5" wheels    Recommendations for Other Services       Precautions / Restrictions Precautions Precautions: Back Precaution Booklet Issued: Yes (comment) Precaution Comments: Reviewed all back precautions Required Braces or Orthoses: Spinal Brace Spinal Brace: Applied in sitting position;Lumbar corset Restrictions Weight Bearing Restrictions: No      Mobility  Bed Mobility Overal bed mobility: Needs Assistance Bed Mobility: Rolling;Sidelying to Sit;Sit to Sidelying Rolling: Min assist Sidelying to sit: Mod assist     Sit to sidelying: Mod assist General bed mobility comments: Min assist w/ rolling w/ support behind shoulder and hip, verbal cues for log roll technique.  Mod assist for sidelying<>sit w/ managing BLEs and supporting trunk to push up to sitting.  Transfers Overall transfer level: Needs assistance Equipment used: Rolling walker (2 wheeled) Transfers: Sit to/from Stand Sit to Stand: Min assist;From elevated surface         General transfer comment: Pt required min assist  to power to standing.  Verbal cues to push from bed.    Ambulation/Gait                Stairs            Wheelchair Mobility    Modified Rankin (Stroke Patients Only)       Balance Overall balance assessment: Needs assistance Sitting-balance support: Bilateral upper extremity supported;Feet supported Sitting balance-Leahy Scale: Fair     Standing balance support: Bilateral upper extremity supported;During functional activity Standing balance-Leahy Scale: Fair                               Pertinent Vitals/Pain Pain Assessment: 0-10 Pain Score: 10-Worst pain ever Pain Location: low back, headache Pain Descriptors / Indicators: Aching;Grimacing;Guarding;Moaning Pain Intervention(s): Limited activity within patient's tolerance;Monitored during session;Repositioned    Home Living Family/patient expects to be discharged to:: Skilled nursing facility Living Arrangements: Alone Available Help at Discharge: Other (Comment) (none) Type of Home: House Home Access: Stairs to enter Entrance Stairs-Rails: Psychiatric nurse of Steps: 5   Home Equipment: Cane - single point Additional Comments: Pt will not have any assist upon d/c and says she has discussed w/ her MD about going to [facility] SN[facility] rather than returning home upon d/c    Prior Function Level of Independence: Independent               Hand Dominance        Extremity/Trunk Assessment               Lower Extremity Assessment: Generalized weakness;RLE  deficits/detail;LLE deficits/detail RLE Deficits / Details: DF: 5/5; unable to assess LE strength 2/2 acute pain in back LLE Deficits / Details: DF: 5/5; unable to assess LE strength 2/2 acute pain in back  Cervical / Trunk Assessment: Other exceptions  Communication   Communication: No difficulties  Cognition Arousal/Alertness: Awake/alert Behavior During Therapy: WFL for tasks assessed/performed Overall Cognitive  Status: Within Functional Limits for tasks assessed                      General Comments General comments (skin integrity, edema, etc.): Pt reported slight dizziness upon sitting which dissipated, denied increase in headache upon sitting; however once pt standing pt reported increase in headache and pt was assist back to sitting and then L sidelying position in bed.  Once L sidelying pt reports headache decreased.  BP 150/109 L sidelying at end of session.    Exercises        Assessment/Plan    PT Assessment Patient needs continued PT services  PT Diagnosis Difficulty walking;Abnormality of gait;Generalized weakness;Acute pain   PT Problem List Decreased strength;Decreased range of motion;Decreased activity tolerance;Decreased balance;Decreased mobility;Decreased coordination;Decreased knowledge of use of DME;Decreased safety awareness;Decreased knowledge of precautions;Decreased skin integrity;Pain  PT Treatment Interventions DME instruction;Gait training;Functional mobility training;Stair training;Therapeutic activities;Therapeutic exercise;Balance training;Neuromuscular re-education;Patient/family education;Modalities   PT Goals (Current goals can be found in the Care Plan section) Acute Rehab PT Goals Patient Stated Goal: to feel better PT Goal Formulation: With patient Time For Goal Achievement: 03/07/15 Potential to Achieve Goals: Good    Frequency Min 5X/week   Barriers to discharge Inaccessible home environment;Decreased caregiver support No assist available upon d/c; 5 steps to enter home    Co-evaluation               End of Session Equipment Utilized During Treatment: Back brace Activity Tolerance: Patient limited by pain;Other (comment) (headache) Patient left: in bed;with call bell/phone within reach;with nursing/sitter in room (nurse tech and family in room) Nurse Communication: Mobility status;Precautions;Other (comment) (pt's increase in headache  upon standing)         Time: 5409-8119 PT Time Calculation (min) (ACUTE ONLY): 27 min   Charges:   PT Evaluation $Initial PT Evaluation Tier I: 1 Procedure PT Treatments $Therapeutic Activity: 8-22 mins   PT G Codes:       Joslyn Hy PT, DPT [phone] Pager: [phone] 02/28/2015, 5:24 PM

## 2015-02-28 NOTE — Anesthesia Postprocedure Evaluation (Signed)
  Anesthesia Post-op Note  Patient: Toni Chan  Procedure(s) Performed: Procedure(s): L5-S1 TLIF (transforaminal lumbar interbody fusion) Revision, Pedicle Instrumentation Removal, Placement  New Instrumentation, Bilateral Fusion (N/A)  Patient Location: PACU  Anesthesia Type:General  Level of Consciousness: awake, alert , oriented and patient cooperative  Airway and Oxygen Therapy: Patient Spontanous Breathing and Patient connected to nasal cannula oxygen  Post-op Pain: mild  Post-op Assessment: Post-op Vital signs reviewed, Patient's Cardiovascular Status Stable, Respiratory Function Stable, Patent Airway, No signs of Nausea or vomiting and Pain level controlled  Post-op Vital Signs: Reviewed and stable  Last Vitals:  Filed Vitals:   02/28/15 1300  BP: 105/47  Pulse: 97  Temp: 37.5 C  Resp: 16    Complications: No apparent anesthesia complications

## 2015-02-28 NOTE — Progress Notes (Signed)
Patient ID: Toni Chan, female   DOB: 1965-10-17, 49 y.o.   MRN: [id number]   Spoke with patient's nurse a short time ago.  States that [name] is still c/o headache.  Advised Dr Lorin Mercy and he wants to see how patient does with getting up with her brace on to see if symptoms worsen.

## 2015-02-28 NOTE — Progress Notes (Signed)
Orthopedic Tech Progress Note Patient Details:  Toni Chan 07/21/1966 [id number]  Patient ID: Toni Chan, female   DOB: Apr 06, 1966, 49 y.o.   MRN: [id number] Called in bio-tech brace order; spoke with Toni Chan, Toni Chan 02/28/2015, 9:25 AM

## 2015-02-28 NOTE — Progress Notes (Signed)
OT Cancellation Note  Patient Details Name: Toni Chan MRN: [id number] DOB: 04/29/1966   Cancelled Treatment:    Reason Eval/Treat Not Completed: Patient not medically ready (Pt on bedrest). Will follow-up as appropriate for OT eval.   Kaari Zeigler , MS, OTR/L, CLT Pager: [phone]  02/28/2015, 1:30 PM

## 2015-03-01 ENCOUNTER — Encounter (HOSPITAL_COMMUNITY): Payer: Self-pay | Admitting: Orthopaedic Surgery

## 2015-03-01 MED ORDER — HYDROCODONE-ACETAMINOPHEN 10-325 MG PO TABS
1.0000 | ORAL_TABLET | ORAL | Status: DC | PRN
  Administered 2015-03-01 – 2015-03-04 (×16): 1 via ORAL
  Filled 2015-03-01 (×17): qty 1

## 2015-03-01 MED FILL — Heparin Sodium (Porcine) Inj 1000 Unit/ML: INTRAMUSCULAR | Qty: 30 | Status: AC

## 2015-03-01 MED FILL — Sodium Chloride IV Soln 0.9%: INTRAVENOUS | Qty: 1000 | Status: AC

## 2015-03-01 NOTE — Clinical Social Work Psychosocial (Signed)
Clinical Social Work Assessment  Patient Details  Name: Toni Chan MRN: [id number] Date of Birth: 02/03/1933  Date of referral:  03/01/15               Reason for consult:  Facility Placement                Permission sought to share information with:  Chartered certified accountant granted to share information::  Yes, Verbal Permission Granted  Name::        Agency::   ([facility] facilities - extended search)  Relationship::     Contact Information:     Housing/Transportation Living arrangements for the past 2 months:  Single Family Home Source of Information:  Patient Patient Interpreter Needed:  None Criminal Activity/Legal Involvement Pertinent to Current Situation/Hospitalization:  No - Comment as needed Significant Relationships:  Other Family Members Lives with:  Self Do you feel safe going back to the place where you live?  No (feels that she needs assistance when discharged) Need for family participation in patient care:  No (Coment)  Care giving concerns:  No caregiver at bedside at time of discharge   Social Worker assessment / plan:  CSW met with patient at bedside to discuss disposition.  Patient is agreeable to STR at SNF at time of discharge.  Patient currently has no insurance for SNF coverage.  Patient is aware of Letter of Guarantee process and extended bed search.  Patient is aware that SNF placement may be out of county and agreeable to search.  Employment status:  Disabled (Comment on whether or not currently receiving Disability) Insurance information:    PT Recommendations:  [place] / Referral to community resources:  [place]  Patient/Family's Response to care:  Patient is very Patent attorney of CSW assistance and placement.  Patient/Family's Understanding of and Emotional Response to Diagnosis, Current Treatment, and Prognosis:  Patient understands her prognosis and admits to be handling the situation  well though she wishes she was able to return home at time of discharge.  Patient is very realistic regarding needed assistance and therapies at time of discharge.  Emotional Assessment Appearance:  Appears older than stated age Attitude/Demeanor/Rapport:   (appropriate) Affect (typically observed):  Accepting Orientation:  Oriented to Self, Oriented to Place, Oriented to  Time, Oriented to Situation Alcohol / Substance use:  Not Applicable Psych involvement (Current and /or in the community):  No (Comment)  Discharge Needs  Concerns to be addressed:  Lack of Support Readmission within the last 30 days:    Current discharge risk:  Inadequate Financial Supports, Lives alone Barriers to Discharge:  Inadequate or no insurance   [place], [place] C, LCSW 03/01/2015, 1:05 PM

## 2015-03-01 NOTE — Progress Notes (Signed)
Orthopedic Tech Progress Note Patient Details:  Toni Chan [birth year] [id number]  Ortho Devices Ortho Device/Splint Location: trapeze bar patient helper Ortho Device/Splint Interventions: Application   Hildred Priest 03/01/2015, 9:59 AM

## 2015-03-01 NOTE — Progress Notes (Signed)
OT Cancellation Note  Patient Details Name: Toni Chan MRN: [id number] DOB: 08-06-1966   Cancelled Treatment:    Reason Eval/Treat Not Completed: Patient not medically ready (Pt on bed rest). Per Rodell Perna, MD (03/01/15 @ 9:49 am) note "Plan Will keep down today and tomorrow then try getting up Sunday." Will follow-up and evaluate pt as appropriate and able.   Darci Lykins , MS, OTR/L, CLT Pager: [phone]  03/01/2015, 10:06 AM

## 2015-03-01 NOTE — Progress Notes (Signed)
PT Cancellation Note  Patient Details Name: Ofilia Rayon MRN: [id number] DOB: 1965-12-18   Cancelled Treatment:    Reason Eval/Treat Not Completed: Medical issues which prohibited therapy (Pt back on bed rest 2/2 severe headache upon standing).  PT will continue to follow acutely.  Joslyn Hy PT, DPT [phone] Pager: [phone] 03/01/2015, 8:48 AM

## 2015-03-01 NOTE — Clinical Social Work Placement (Signed)
   CLINICAL SOCIAL WORK PLACEMENT  NOTE  Date:  03/01/2015  Patient Details  Name: Toni Chan MRN: [id number] Date of Birth: 02/03/1933  Clinical Social Work is seeking post-discharge placement for this patient at the [place] level of care (*CSW will initial, date and re-position this form in  chart as items are completed):  Yes   Patient/family provided with [facility] Work Department's list of facilities offering this level of care within the geographic area requested by the patient (or if unable, by the patient's family).  Yes   Patient/family informed of their freedom to choose among providers that offer the needed level of care, that participate in Medicare, Medicaid or managed care program needed by the patient, have an available bed and are willing to accept the patient.  Yes   Patient/family informed of [facility]'s ownership interest in [facility] and [facility], as well as of the fact that they are under no obligation to receive care at these facilities.  PASRR submitted to EDS on 03/01/15     PASRR number received on 03/01/15     Existing PASRR number confirmed on       FL2 transmitted to all facilities in geographic area requested by pt/family on 03/01/15     FL2 transmitted to all facilities within larger geographic area on 03/01/15     Patient informed that his/her managed care company has contracts with or will negotiate with certain facilities, including the following:            Patient/family informed of bed offers received.  Patient chooses bed at       Physician recommends and patient chooses bed at      Patient to be transferred to   on  .  Patient to be transferred to facility by       Patient family notified on   of transfer.  Name of family member notified:        PHYSICIAN       Additional Comment:    _______________________________________________ Dulcy Fanny, LCSW 03/01/2015, 1:08 PM

## 2015-03-01 NOTE — Progress Notes (Signed)
Subjective: 2 Days Post-Op Procedure(s) (LRB): L5-S1 TLIF (transforaminal lumbar interbody fusion) Revision, Pedicle Instrumentation Removal, Placement  New Instrumentation, Bilateral Fusion (N/A) Patient reports pain as severe.  Pain in her hips. Right S1 foot numbeness and weakness gone.  Headache yesterday when she tried to get up. Better with supine. No dura decompressed but she did have right S1 screw malpositioned which was corrected. No spinal leakage seen from screw hole but with headache it is likely source from malpositioned screw from surgery in [place].  Objective: Vital signs in last 24 hours: Temp:  [98.8 F (37.1 C)-99.5 F (37.5 C)] 98.8 F (37.1 C) (06/03 0517) Pulse Rate:  [91-97] 91 (06/03 0517) Resp:  [16-20] 18 (06/03 0517) BP: (95-150)/(42-110) 102/49 mmHg (06/03 0517) SpO2:  [94 %-96 %] 96 % (06/03 0517)  Intake/Output from previous day: 06/02 0701 - 06/03 0700 In: 1495.8 [P.O.:480; I.V.:1015.8] Out: 4850 [Urine:4850] Intake/Output this shift: Total I/O In: -  Out: 1150 [Urine:1150]   Recent Labs  02/28/15 0508  HGB 9.3*    Recent Labs  02/28/15 0508  WBC 8.9  RBC 3.06*  HCT 28.9*  PLT 165    Recent Labs  02/28/15 0508 02/28/15 2214  NA 136 138  K 3.7 3.7  CL 104 106  CO2 26 27  BUN <5* <5*  CREATININE 0.72 0.80  GLUCOSE 108* 143*  CALCIUM 7.8* 7.7*   No results for input(s): LABPT, INR in the last 72 hours.  Neurologically intact  Assessment/Plan: 2 Days Post-Op Procedure(s) (LRB): L5-S1 TLIF (transforaminal lumbar interbody fusion) Revision, Pedicle Instrumentation Removal, Placement  New Instrumentation, Bilateral Fusion (N/A) Plan   Will keep down today and tomorrow then try getting up Sunday.   Nga Rabon C 03/01/2015, 9:45 AM

## 2015-03-02 MED ORDER — DULOXETINE HCL 30 MG PO CPEP
30.0000 mg | ORAL_CAPSULE | Freq: Every day | ORAL | Status: DC
  Administered 2015-03-02 – 2015-03-03 (×2): 30 mg via ORAL
  Filled 2015-03-02 (×2): qty 1

## 2015-03-02 NOTE — Progress Notes (Signed)
CSW provided list of bed offers for SNF facilities.   Pt/Family prefers:  [facility]  2S, 17M,3S, 5N, 6N [phone]

## 2015-03-02 NOTE — Progress Notes (Signed)
Subjective: 3 Days Post-Op Procedure(s) (LRB): L5-S1 TLIF (transforaminal lumbar interbody fusion) Revision, Pedicle Instrumentation Removal, Placement  New Instrumentation, Bilateral Fusion (N/A) Patient reports pain as 7 on 0-10 scale.    Objective: Vital signs in last 24 hours: Temp:  [99.1 F (37.3 C)-99.2 F (37.3 C)] 99.2 F (37.3 C) (06/03 2025) Pulse Rate:  [95-99] 99 (06/03 2025) Resp:  [18] 18 (06/03 1300) BP: (107-111)/(43-58) 107/43 mmHg (06/03 2025) SpO2:  [98 %-99 %] 99 % (06/03 2025)  Intake/Output from previous day: 06/03 0701 - 06/04 0700 In: 480 [P.O.:480] Out: 4800 [Urine:4800] Intake/Output this shift: Total I/O In: -  Out: 1650 [Urine:1650]   Recent Labs  02/28/15 0508  HGB 9.3*    Recent Labs  02/28/15 0508  WBC 8.9  RBC 3.06*  HCT 28.9*  PLT 165    Recent Labs  02/28/15 0508 02/28/15 2214  NA 136 138  K 3.7 3.7  CL 104 106  CO2 26 27  BUN <5* <5*  CREATININE 0.72 0.80  GLUCOSE 108* 143*  CALCIUM 7.8* 7.7*   No results for input(s): LABPT, INR in the last 72 hours.  Neurologically intact  Assessment/Plan: 3 Days Post-Op Procedure(s) (LRB): L5-S1 TLIF (transforaminal lumbar interbody fusion) Revision, Pedicle Instrumentation Removal, Placement  New Instrumentation, Bilateral Fusion (N/A) Plan:  Still will keep flat today then start to let her get up on Sunday.   Toni Chan C 03/02/2015, 5:54 AM

## 2015-03-03 NOTE — Evaluation (Addendum)
Occupational Therapy Evaluation Patient Details Name: Toni Chan MRN: [id number] DOB: 06/22/1966 Today's Date: 03/03/2015    History of Present Illness Pt is a 49 y.o. F s/p L5-S1 TLIF revision, pedicle instrumentation removal, placement of new instrumentation, B fusion.  Pt's PMH includes lumbar fusion, anxiety, depression.   Clinical Impression   Pt s/p above. Pt independent with ADLs, PTA. Feel pt will benefit from acute OT to increase independence prior to d/c. Recommending SNF for rehab as pt lives alone.    Follow Up Recommendations  SNF    Equipment Recommendations  Other (comment) (AE; TBD)    Recommendations for Other Services       Precautions / Restrictions Precautions Precautions: Back Precaution Booklet Issued: No Precaution Comments: reviewed back precautions Required Braces or Orthoses: Spinal Brace Spinal Brace: Applied in sitting position (aspen lumbar brace) Restrictions Weight Bearing Restrictions: No      Mobility Bed Mobility Overal bed mobility: Needs Assistance Bed Mobility: Sidelying to Sit Rolling: Supervision            Transfers Overall transfer level: Needs assistance   Transfers: Sit to/from Stand Sit to Stand: Min guard         General transfer comment: cues for hand placement    Balance  Min guard for ambulation. Balance not formally assessed.                                          ADL Overall ADL's : Needs assistance/impaired      Eating/Feeding: Independent; Sitting           Upper Body Dressing : Sitting;Minimal assistance   Lower Body Dressing: Maximal assistance;Sit to/from stand   Toilet Transfer: Min guard;Ambulation;RW (3 in 1 over commode)   Toileting- Clothing Manipulation and Hygiene: Sit to/from stand;Moderate assistance (without breaking precautions) Toileting - Clothing Manipulation Details (indicate cue type and reason): simulated reaching behind and pt reports feeling  like she is twisting; pt wiped front area but cues given for precaution when wiping front     Functional mobility during ADLs: Min guard;Rolling walker General ADL Comments: Educated on back brace and assisted with donning it. Discussed safe footwear. Educated on AE for LB ADLs.  Discussed what pt could use for toilet aide for hygiene. Discussed incorporating precautions during functional activities and suggested use of cup for oral care.     Vision  Pt wears reading glasses   Perception     Praxis      Pertinent Vitals/Pain Pain Assessment: No/denies pain Pain Intervention(s): Premedicated before session     Hand Dominance     Extremity/Trunk Assessment Upper Extremity Assessment Upper Extremity Assessment: Overall WFL for tasks assessed   Lower Extremity Assessment Lower Extremity Assessment: Defer to PT evaluation       Communication Communication Communication: No difficulties   Cognition Arousal/Alertness: Awake/alert Behavior During Therapy: WFL for tasks assessed/performed Overall Cognitive Status: Within Functional Limits for tasks assessed                     General Comments       Exercises       Shoulder Instructions      Home Living Family/patient expects to be discharged to:: Skilled nursing facility Living Arrangements: Alone   Type of Home: House Home Access: Stairs to enter Entrance Stairs-Number of Steps: 5 Entrance Stairs-Rails: Right;Left  Home Equipment: Cane - single point   Additional Comments: Pt will not have any assist upon d/c and says she has discussed w/ her MD about going to [facility] SNF rather than returning home upon d/c      Prior Functioning/Environment Level of Independence: Independent             OT Diagnosis: Generalized weakness;Acute pain   OT Problem List: Decreased strength;Decreased range of motion;Decreased knowledge of use of DME or AE;Decreased knowledge of precautions   OT  Treatment/Interventions: Self-care/ADL training;DME and/or AE instruction;Therapeutic activities;Patient/family education;Balance training    OT Goals(Current goals can be found in the care plan section) Acute Rehab OT Goals Patient Stated Goal: no pain OT Goal Formulation: With patient Time For Goal Achievement: 03/10/15 Potential to Achieve Goals: Good ADL Goals Pt Will Perform Lower Body Bathing: with set-up;with adaptive equipment;sit to/from stand Pt Will Perform Lower Body Dressing: with set-up;with adaptive equipment;sit to/from stand Pt Will Transfer to Toilet: ambulating;with supervision Pt Will Perform Toileting - Clothing Manipulation and hygiene: with set-up;sit to/from stand Additional ADL Goal #1: Pt will independently verbalize 3/3 back precautions and maintain during ADLs/functional activities. Additional ADL Goal #2: Pt will don/doff back brace with setup assist.  OT Frequency: Min 2X/week   Barriers to D/C:            Co-evaluation              End of Session Equipment Utilized During Treatment: Gait belt;Rolling walker;Back brace Nurse Communication: Mobility status;Other (comment) (needs tape on IV; pt in chair; no pain; pt to have brace on when up)  Activity Tolerance: Patient tolerated treatment well Patient left: in chair;with call bell/phone within reach   Time: 1000-1019 OT Time Calculation (min): 19 min Charges:  OT General Charges $OT Visit: 1 Procedure OT Evaluation $Initial OT Evaluation Tier I: 1 Procedure G-CodesBenito Mccreedy OTR/L [id number] 03/03/2015, 11:03 AM

## 2015-03-03 NOTE — Progress Notes (Signed)
Subjective: 4 Days Post-Op Procedure(s) (LRB): L5-S1 TLIF (transforaminal lumbar interbody fusion) Revision, Pedicle Instrumentation Removal, Placement  New Instrumentation, Bilateral Fusion (N/A) Patient reports pain as moderate incisional back pain. Head on 2 to 3 pillows no headache.   Objective: Vital signs in last 24 hours: Temp:  [98 F (36.7 C)-98.8 F (37.1 C)] 98 F (36.7 C) (06/05 0548) Pulse Rate:  [80-89] 89 (06/05 0548) Resp:  [16] 16 (06/05 0548) BP: (91-104)/(54-72) 91/72 mmHg (06/05 0548) SpO2:  [96 %-98 %] 98 % (06/05 0548)  Intake/Output from previous day: 06/04 0701 - 06/05 0700 In: 600 [P.O.:600] Out: 3950 [Urine:3950] Intake/Output this shift: Total I/O In: -  Out: 2500 [Urine:2500]  No results for input(s): HGB in the last 72 hours. No results for input(s): WBC, RBC, HCT, PLT in the last 72 hours.  Recent Labs  02/28/15 2214  NA 138  K 3.7  CL 106  CO2 27  BUN <5*  CREATININE 0.80  GLUCOSE 143*  CALCIUM 7.7*   No results for input(s): LABPT, INR in the last 72 hours.  Neurologically intact  Assessment/Plan: 4 Days Post-Op Procedure(s) (LRB): L5-S1 TLIF (transforaminal lumbar interbody fusion) Revision, Pedicle Instrumentation Removal, Placement  New Instrumentation, Bilateral Fusion (N/A) Up with therapy.  SL IV , d/c foley.   Analei Whinery C 03/03/2015, 6:21 AM

## 2015-03-03 NOTE — Progress Notes (Signed)
Patient ID: Toni Chan, female   DOB: Oct 27, 1965, 50 y.o.   MRN: [id number] Foley catheter was left in due to likely CSF leak from screw placement that was placed in [place]. Patient has been kept at bedrest and will resume PT today.

## 2015-03-04 MED ORDER — METHOCARBAMOL 500 MG PO TABS: 500.0000 mg | ORAL_TABLET | Freq: Four times a day (QID) | ORAL | Status: DC | PRN

## 2015-03-04 MED ORDER — OXYCODONE-ACETAMINOPHEN 5-325 MG PO TABS: 1.0000 | ORAL_TABLET | Freq: Four times a day (QID) | ORAL | Status: DC | PRN

## 2015-03-04 NOTE — Discharge Summary (Signed)
Patient ID: Toni Chan MRN: [id number] DOB/AGE: [birth year] 49 y.o.  Admit date: 02/27/2015 Discharge date: 03/04/2015  Admission Diagnoses:  Active Problems:   S/P lumbar fusion   Discharge Diagnoses:  Active Problems:   S/P lumbar fusion  status post Procedure(s): L5-S1 TLIF (transforaminal lumbar interbody fusion) Revision, Pedicle Instrumentation Removal, Placement  New Instrumentation, Bilateral Fusion  Past Medical History  Diagnosis Date  . IBS (irritable bowel syndrome)   . Chronic back pain   . Cervical cancer 1989    Vaginal hysterectomy and oophorectomy 1993  . Kidney stones 2014  . Renal cyst 2014  . Endometriosis   . GERD (gastroesophageal reflux disease)   . Superficial thrombophlebitis   . Placental abruption   . Pilonidal cyst 2015  . Pneumonia   . Anxiety   . Depression   . Arthritis   . Hx MRSA infection 2014    in the area of the scar fr. having coccyx removal    Surgeries: Procedure(s): L5-S1 TLIF (transforaminal lumbar interbody fusion) Revision, Pedicle Instrumentation Removal, Placement  New Instrumentation, Bilateral Fusion on 02/27/2015   Consultants:    Discharged Condition: Improved  Hospital Course: Toni Chan is an 49 y.o. female who was admitted 02/27/2015 for operative treatment of malpositioned screw and pseudoarthrosis.  S/p lumbar fusion in [place]. . Patient failed conservative treatments (please see the history and physical for the specifics) and had severe unremitting pain that affects sleep, daily activities and work/hobbies. After pre-op clearance, the patient was taken to the operating room on 02/27/2015 and underwent  Procedure(s): L5-S1 TLIF (transforaminal lumbar interbody fusion) Revision, Pedicle Instrumentation Removal, Placement  New Instrumentation, Bilateral Fusion.    Patient was given perioperative antibiotics: Anti-infectives    Start     Dose/Rate Route Frequency Ordered Stop   02/27/15 1200  ceFAZolin (ANCEF)  IVPB 2 g/50 mL premix     2 g 100 mL/hr over 30 Minutes Intravenous To Surgery 02/26/15 1032 02/27/15 1644       Patient was given sequential compression devices and early ambulation to prevent DVT.   Patient benefited maximally from hospital stay and there were no complications. At the time of discharge, the patient was urinating/moving their bowels without difficulty, tolerating a regular diet, pain is controlled with oral pain medications and they have been cleared by PT/OT.   Recent vital signs: Patient Vitals for the past 24 hrs:  BP Temp Temp src Pulse Resp SpO2  03/04/15 0613 108/65 mmHg 98.4 F (36.9 C) Oral 72 16 98 %  03/03/15 2020 113/70 mmHg 98.5 F (36.9 C) Oral 83 18 97 %  03/03/15 1234 112/60 mmHg 97.5 F (36.4 C) Oral 88 18 97 %     Recent laboratory studies: No results for input(s): WBC, HGB, HCT, PLT, NA, K, CL, CO2, BUN, CREATININE, GLUCOSE, INR, CALCIUM in the last 72 hours.  Invalid input(s): PT, 2   Discharge Medications:     Medication List    STOP taking these medications        HYDROcodone-acetaminophen 10-325 MG per tablet  Commonly known as:  NORCO     pregabalin 75 MG capsule  Commonly known as:  LYRICA      TAKE these medications        DULoxetine 60 MG capsule  Commonly known as:  CYMBALTA  Take 1 capsule (60 mg total) by mouth daily.     gabapentin 300 MG capsule  Commonly known as:  NEURONTIN  Take 2 capsules (600 mg  total) by mouth 3 (three) times daily.     methocarbamol 500 MG tablet  Commonly known as:  ROBAXIN  Take 1 tablet (500 mg total) by mouth every 6 (six) hours as needed for muscle spasms.     omeprazole 20 MG capsule  Commonly known as:  PRILOSEC  Take 1 capsule (20 mg total) by mouth daily.     oxyCODONE-acetaminophen 5-325 MG per tablet  Commonly known as:  ROXICET  Take 1-2 tablets by mouth every 6 (six) hours as needed for severe pain.        Diagnostic Studies: Dg Chest 2 View  02/20/2015   CLINICAL  DATA:  Preoperative examination prior to lumbar surgery; current smoker  EXAM: CHEST  2 VIEW  COMPARISON:  PA and lateral chest x-ray of March 14, 2012  FINDINGS: The lungs are adequately inflated. There is no focal infiltrate. There is no pleural effusion. The heart and mediastinal structures are normal. The bony thorax is unremarkable.  IMPRESSION: There is no active cardiopulmonary disease.   Electronically Signed   By: David  Martinique M.D.   On: 02/20/2015 16:13   Dg Lumbar Spine Complete  02/27/2015   CLINICAL DATA:  Lumbar fusion.  EXAM: DG C-ARM 61-120 MIN; LUMBAR SPINE - COMPLETE 4+ VIEW  COMPARISON:  CT lumbar spine 01/18/2015  FINDINGS: Multiple intraoperative fluoroscopic spot images demonstrate pedicle screws, posterior rods and interbody fusion device at L5-S1. The hardware appears in good position without complicating features.  IMPRESSION: L5-S1 fusion hardware in good position without complicating features.   Electronically Signed   By: Marijo Sanes M.D.   On: 02/27/2015 16:45   Dg C-arm 61-120 Min  02/27/2015   CLINICAL DATA:  Lumbar fusion.  EXAM: DG C-ARM 61-120 MIN; LUMBAR SPINE - COMPLETE 4+ VIEW  COMPARISON:  CT lumbar spine 01/18/2015  FINDINGS: Multiple intraoperative fluoroscopic spot images demonstrate pedicle screws, posterior rods and interbody fusion device at L5-S1. The hardware appears in good position without complicating features.  IMPRESSION: L5-S1 fusion hardware in good position without complicating features.   Electronically Signed   By: Marijo Sanes M.D.   On: 02/27/2015 16:45        Discharge Instructions    Call MD / Call 911    Complete by:  As directed   If you experience chest pain or shortness of breath, CALL 911 and be transported to the hospital emergency room.  If you develope a fever above 101 F, pus (white drainage) or increased drainage or redness at the wound, or calf pain, call your surgeon's office.     Constipation Prevention    Complete by:  As  directed   Drink plenty of fluids.  Prune juice may be helpful.  You may use a stool softener, such as Colace (over the counter) 100 mg twice a day.  Use MiraLax (over the counter) for constipation as needed.     Diet - low sodium heart healthy    Complete by:  As directed      Discharge instructions    Complete by:  As directed   Ok to shower 5 days postop.  Do not apply any creams or ointments to incision.  Do not remove steri-strips.  Can use 4x4 gauze and tape for dressing changes.  No aggressive activity.  No bending, squatting or prolonged sitting.  Mostly be in reclined position or lying down.  Must wear lumbar brace when up and ambulating.     Driving restrictions  Complete by:  As directed   No driving     Increase activity slowly as tolerated    Complete by:  As directed      Lifting restrictions    Complete by:  As directed   No lifting           Follow-up Information    Schedule an appointment as soon as possible for a visit with Marybelle Killings, MD.   Specialty:  Orthopedic Surgery   Why:  needs return office visit 2 weeks postop   Contact information:   [place] [address] [phone]       Discharge Plan:  discharge to SNF      Signed: [place] for Dr Elta Guadeloupe C. Lorin Mercy [phone] 03/04/2015, 11:05 AM

## 2015-03-04 NOTE — Clinical Social Work Placement (Signed)
   CLINICAL SOCIAL WORK PLACEMENT  NOTE  Date:  03/04/2015  Patient Details  Name: Tanisia Yokley MRN: [id number] Date of Birth: 1966-01-13  Clinical Social Work is seeking post-discharge placement for this patient at the [place] level of care (*CSW will initial, date and re-position this form in  chart as items are completed):  Yes   Patient/family provided with [facility] Work Department's list of facilities offering this level of care within the geographic area requested by the patient (or if unable, by the patient's family).  Yes   Patient/family informed of their freedom to choose among providers that offer the needed level of care, that participate in Medicare, Medicaid or managed care program needed by the patient, have an available bed and are willing to accept the patient.  Yes   Patient/family informed of [facility]'s ownership interest in [facility] and [facility], as well as of the fact that they are under no obligation to receive care at these facilities.  PASRR submitted to EDS on 03/04/15     PASRR number received on 03/04/15     Existing PASRR number confirmed on       FL2 transmitted to all facilities in geographic area requested by pt/family on 03/04/15     FL2 transmitted to all facilities within larger geographic area on 03/04/15     Patient informed that his/her managed care company has contracts with or will negotiate with certain facilities, including the following:   (no payer source)     Yes   Patient/family informed of bed offers received.  Patient chooses bed at [facility]     Physician recommends and patient chooses bed at  (none)    Patient to be transferred to [facility] on 03/04/15.  Patient to be transferred to facility by [facility]     Patient family notified on 03/04/15 of transfer.  Name of family member notified:  patient is alert and oriented and states has no  family/supports     PHYSICIAN Please prepare priority discharge summary, including medications     Additional Comment:    _______________________________________________ Dulcy Fanny, LCSW 03/04/2015, 11:55 AM

## 2015-03-04 NOTE — Care Management Note (Signed)
Case Management Note  Patient Details  Name: Toni Chan MRN: [id number] Date of Birth: [birth year]  Subjective/Objective:                 S/p lumbar fusion   Action/Plan:   Expected Discharge Date:                  Expected Discharge Plan:  Skilled Nursing Facility  In-House Referral:  Clinical Social Work  Discharge planning Services  CM Consult  Post Acute Care Choice:  NA Choice offered to:  NA  DME Arranged:    DME Agency:     HH Arranged:    [place] Agency:     Status of Service:  Completed, signed off  Medicare Important Message Given:    Date Medicare IM Given:    Medicare IM give by:    Date Additional Medicare IM Given:    Additional Medicare Important Message give by:     If discussed at [place] of Stay Meetings, dates discussed:    Additional Comments:  Nila Nephew, RN 03/04/2015, 12:08 PM

## 2015-03-04 NOTE — Progress Notes (Signed)
Physical Therapy Treatment Patient Details Name: Toni Chan MRN: [id number] DOB: 1966/01/12 Today's Date: 03/04/2015    History of Present Illness Pt is a 49 y.o. F s/p L5-S1 TLIF revision, pedicle instrumentation removal, placement of new instrumentation, B fusion.  Pt's PMH includes lumbar fusion, anxiety, depression.    PT Comments    Pt denied sx of dizziness or headache this session.  Pt demonstrated ability to apply spinal brace in sitting prior to ambulation.  Pt ambulated 40 ft this session, limited by N/T of RLE and fatigue and pain in RLE.  Pt will benefit from continued skilled PT services to increase functional independence and safety as pt is not yet functionally safe to return home w/o assist.   Follow Up Recommendations  SNF     Equipment Recommendations  Rolling walker with 5" wheels    Recommendations for Other Services       Precautions / Restrictions Precautions Precautions: Back Precaution Comments: reviewed back precautions Required Braces or Orthoses: Spinal Brace Spinal Brace: Applied in sitting position Restrictions Weight Bearing Restrictions: No    Mobility  Bed Mobility Overal bed mobility: Modified Independent Bed Mobility: Rolling;Sidelying to Sit Rolling: Modified independent (Device/Increase time) Sidelying to sit: Modified independent (Device/Increase time)       General bed mobility comments: Mod I w/ use of bed rails.  No verbal cues required for proper log roll technique.  Transfers Overall transfer level: Needs assistance Equipment used: Rolling walker (2 wheeled) Transfers: Sit to/from Stand Sit to Stand: Min guard         General transfer comment: Min guard for safety.  Pt w/ good carryover for hand placement  Ambulation/Gait Ambulation/Gait assistance: Min guard Ambulation Distance (Feet): 40 Feet Assistive device: Rolling walker (2 wheeled) Gait Pattern/deviations: Step-through pattern;Decreased stride  length;Antalgic Gait velocity: very decreased Gait velocity interpretation: <1.8 ft/sec, indicative of risk for recurrent falls General Gait Details: Pt reports tingling R ant lower leg  after ambulating 20 ft so pt turned around to return to room.  After ambulating 30 ft pt reported pain in posterior upper leg.   Stairs            Wheelchair Mobility    Modified Rankin (Stroke Patients Only)       Balance Overall balance assessment: Needs assistance Sitting-balance support: Bilateral upper extremity supported;Feet supported Sitting balance-Leahy Scale: Fair     Standing balance support: Bilateral upper extremity supported;During functional activity Standing balance-Leahy Scale: Fair                      Cognition Arousal/Alertness: Awake/alert Behavior During Therapy: WFL for tasks assessed/performed Overall Cognitive Status: Within Functional Limits for tasks assessed                      Exercises      General Comments General comments (skin integrity, edema, etc.): Pt denies sx of dizziness or headache this session      Pertinent Vitals/Pain Pain Assessment: 0-10 Pain Score: 5  Pain Location: back over incision Pain Descriptors / Indicators: Aching;Grimacing (inc w/ mobility) Pain Intervention(s): Limited activity within patient's tolerance;Monitored during session;Repositioned;Premedicated before session;Ice applied    Home Living                      Prior Function            PT Goals (current goals can now be found in the care plan section) Acute Rehab  PT Goals Patient Stated Goal: no pain Progress towards PT goals: Progressing toward goals    Frequency  Min 5X/week    PT Plan Current plan remains appropriate    Co-evaluation             End of Session Equipment Utilized During Treatment: Back brace Activity Tolerance: Patient tolerated treatment well;Patient limited by pain;Patient limited by fatigue Patient  left: in chair;with call bell/phone within reach     Time: 1203-1234 PT Time Calculation (min) (ACUTE ONLY): 31 min  Charges:  $Gait Training: 23-37 mins                    G Codes:      Joslyn Hy PT, [place] [phone] Pager: [phone] 03/04/2015, 1:31 PM

## 2015-03-04 NOTE — Progress Notes (Signed)
Subjective: Feeling much better.  Has been up in recliner.  Denies headache, photophobia, nausea, dizziness.  preop leg pain much better.     Objective: Vital signs in last 24 hours: Temp:  [97.5 F (36.4 C)-98.5 F (36.9 C)] 98.4 F (36.9 C) (06/06 1610) Pulse Rate:  [72-88] 72 (06/06 0613) Resp:  [16-18] 16 (06/06 0613) BP: (108-113)/(60-70) 108/65 mmHg (06/06 0613) SpO2:  [97 %-98 %] 98 % (06/06 0613)  Intake/Output from previous day: 06/05 0701 - 06/06 0700 In: 600 [P.O.:600] Out: -  Intake/Output this shift: Total I/O In: 240 [P.O.:240] Out: -   No results for input(s): HGB in the last 72 hours. No results for input(s): WBC, RBC, HCT, PLT in the last 72 hours. No results for input(s): NA, K, CL, CO2, BUN, CREATININE, GLUCOSE, CALCIUM in the last 72 hours. No results for input(s): LABPT, INR in the last 72 hours.  Exam:  Alert and oriented.  Wound looks good.  steris intact.  No drainage or signs of infection.  bilat calves nontender.  NVI.  No gross focal neurologic deficits.    Assessment/Plan: Will have patient ambulate with PT today.  Discussed with my attending Dr Lorin Mercy and ok to transfer patient to SNF when bed is available.     Teisha Trowbridge M 03/04/2015, 10:55 AM

## 2015-03-04 NOTE — Discharge Planning (Signed)
Patient will discharge today per MD order. Patient will discharge to [facility] RN to call report prior to transportation to [phone] Transportation: [facility] (to be scheduled for 2pm)  CSW sent discharge summary to SNF for review.  Packet is complete.  RN and patient aware of discharge plans.  Nonnie Done, [place] [phone]  Psychiatric & Orthopedics (5N 1-16) Clinical Social Worker

## 2015-03-08 ENCOUNTER — Telehealth: Payer: Self-pay | Admitting: Family Medicine

## 2015-03-08 NOTE — Telephone Encounter (Signed)
Returned patient's call regarding the Rx for hydrocodone. Pt stated Dr. Lorin Mercy' prescribed hydrocodone 1 every 4 hours and to continue the medication for another month and she is requesting Dr. Ree Kida to change the directions on the Rx that he prescribed to reflect the same direction from Dr. Lorin Mercy.  Explained to patient that Dr. Ree Kida is out of the office and will return on Monday.  However, nurse will forward the message to covering provider and that there is no guarantee that the medication direction will change.  Normally any change in pain medication and direction would come from her PCP.  Pt stated she just had surgery and want the directions to reflect what another provider prescribed for her.  Pt stated she told Dr. Lorin Mercy that she had another Rx that was prescribed by PCP so he did not write for a refill.  Please advise.  Derl Barrow, RN

## 2015-03-08 NOTE — Telephone Encounter (Signed)
She needs to fill the script she has dated June 1 from Dr. Ree Kida.  This will get her through the weekend from a pain relief standpoint.  At that point, when he returns, he can increase the dosage or change the SIG based on what he thinks is good care.

## 2015-03-08 NOTE — Telephone Encounter (Signed)
Patient was recently discharged from the hospital for surgery and Dr. Lorin Mercy prescribed her hydrocodone while she was in the hospital with the instructions for 1 pill every 4 hours. He instructed her to continue this medication for one more month, and she informed him that she already had a rx for this medication from Dr. Ree Kida that wasn't due to be filled until June 1st 2016, so he didn't give her another rx. She wanted to inform her PCP of this so it doesn't look like she is trying to get extra pain medication and she still only has one Rx for this to be filled. However, the Rx that she is needing to get filled has the instructions to take one every 6 hours and she needs it to say one every 4 hours. Her pharmacy is [facility] in [place], [place] address is [address] [place] [address]; phone: [phone]. Please contact the patient if more information or clarification is needed, as well as to let her know that it is done. She only has one dosage left. Thank you, Fonda Kinder, ASA

## 2015-03-08 NOTE — Telephone Encounter (Signed)
Patient informed per Dr. Mingo Amber patient should fill Rx given to her on June 1 from Dr. Ree Kida.  Pt can schedule an appt with Dr. Ree Kida to discuss any changes in dosage or frequency.  Pt stated she spoke with Dr. Lorin Mercy because he was the surgeon and Dr. Tawni Carnes is aware of the surgery she had.  Pt stated that "Dr. Ree Kida was aboard and Dr. Lorin Mercy would discuss this with Dr. Ree Kida when he returns."  Pt stated she was not going to get the Rx filled by Dr. Ree Kida for 1 every 6 hours because it she reflect the 1 every 4 hours prescribed by Dr. Lorin Mercy.  Derl Barrow, RN

## 2015-03-10 NOTE — Telephone Encounter (Signed)
Note to nursing staff - I am out of town this coming week as well (week of 6/13). I am therefore not able to write a new script. Please call the patient and tell her to fill the script that I provided to her to be filled on 6/1. The script was written for oxycodone-tylenol 5-325 1-2 tablets every 6 hours prn pain (she can take one every four hours if she likes, tell her specifically that I am ok with this). Schedule her for follow up at my next available (may not be for a few weeks). Thanks.

## 2015-03-11 NOTE — Telephone Encounter (Signed)
Left message on voicemail with message from PCP, asked that patient call back with any questions.

## 2015-03-18 ENCOUNTER — Encounter (HOSPITAL_COMMUNITY): Payer: Self-pay | Admitting: Orthopaedic Surgery

## 2015-03-26 ENCOUNTER — Encounter: Payer: Self-pay | Admitting: Family Medicine

## 2015-03-26 ENCOUNTER — Ambulatory Visit (INDEPENDENT_AMBULATORY_CARE_PROVIDER_SITE_OTHER): Payer: Self-pay | Admitting: Family Medicine

## 2015-03-26 VITALS — BP 113/73 | HR 76 | Temp 98.3°F | Ht 62.0 in | Wt 156.8 lb

## 2015-03-26 DIAGNOSIS — G8929 Other chronic pain: Secondary | ICD-10-CM

## 2015-03-26 DIAGNOSIS — M5136 Other intervertebral disc degeneration, lumbar region: Secondary | ICD-10-CM

## 2015-03-26 DIAGNOSIS — F32A Depression, unspecified: Secondary | ICD-10-CM

## 2015-03-26 DIAGNOSIS — Z7189 Other specified counseling: Secondary | ICD-10-CM

## 2015-03-26 DIAGNOSIS — F329 Major depressive disorder, single episode, unspecified: Secondary | ICD-10-CM

## 2015-03-26 MED ORDER — OXYCODONE-ACETAMINOPHEN 5-325 MG PO TABS: 1.0000 | ORAL_TABLET | Freq: Four times a day (QID) | ORAL | Status: DC | PRN

## 2015-03-26 NOTE — Patient Instructions (Signed)
It was nice to see you today.   Please continue to wean the Gabapentin and Hydrocodone. Hold on to the Lyrica for now.   Please continue to take the Cymbalta.   Attempt a trial of Melatonin 30 minutes before you go to bed.

## 2015-03-27 ENCOUNTER — Telehealth: Payer: Self-pay | Admitting: Family Medicine

## 2015-03-27 MED ORDER — HYDROCODONE-ACETAMINOPHEN 5-325 MG PO TABS: 1.0000 | ORAL_TABLET | Freq: Four times a day (QID) | ORAL | Status: DC | PRN

## 2015-03-27 NOTE — Assessment & Plan Note (Signed)
One month supply of Roxicet provided. Patient weaning pain meds post-op. See problem list item for lumbar DDD.

## 2015-03-27 NOTE — Progress Notes (Addendum)
   Subjective:    Patient ID: Toni Chan, female    DOB: 1966/07/29, 48 y.o.   MRN: [id number]  HPI 49 y/o female presents for hospital follow up of back surgery on 02/27/15.   Patient underwent Exploration of lumbar fusion, removal of Medtronic Sextantpedicle instrumentation L5-S1. Removal of screw in right S1 nerve root. Bilateral lateral gutter fusion with placement of Synthes DePuy Matrix screws 7 x 45 screws in L5 and 7 x 35 mm screws in S1. Bilateral 40 mm Rods. Surgery performed by Dr. Lorin Mercy.  Patient reports that she is doing very well postsurgery, pain is much improved compared to prior the surgery, pain currently 5-6/10, located in low back with some radiation of pain down the right posterior buttock and leg, she has been gradually weaning her pain medications, currently taking Gabapentin 300 mg TID and only 1-2 Norco per day, she plans to completely wean all pain medications over the new few weeks, she is walking daily, wears back brace while ambulating, she has started driving very short distances, she did report some swelling/redness at the superior aspect of the incision site last week, she was ordered to have bed rest for a few days by Dr. Lorin Mercy, these symptoms have resolved, no bladder or bowel incontinence, no LE weaknss  Depression - mood is much improved, less anhedonia, still very tired throughout the day, reports sleeping during the day and then not being able to sleep at night, no change in diet or concentration, no SI   Review of Systems  Constitutional: Negative for fever, chills and fatigue.  Respiratory: Negative for cough and shortness of breath.   Cardiovascular: Negative for chest pain.  Gastrointestinal: Negative for nausea, diarrhea and rectal pain.       Objective:   Physical Exam Vitals: Reviewed Gen: pleasant female, NAD MSK: 14.5 cm midline lumbar incision that is healing well, no erythema or drainage, mild diffuse lumbar tenderness Neuro: standing  during exam, no weakness of LE on exam Psych: appropriately dressed, smiling, outgoing affect, mood is stated as good, does not appear to be responding to internal stimuli, no SI or HI      Assessment & Plan:  Please see problem specific assessment and plan.

## 2015-03-27 NOTE — Telephone Encounter (Signed)
Patient called to report that she is in [place] at pharmacy. Attempted to refill narcotic. Noted that prescription was for Percocet and not Norco. Patient would like script for Norco as she is attempting to wean off narcotics. I informed her that I was unfortunately unable to call in this prescription (due to it being a narcotic) and that she would need to come and pickup the new script. She has not been driving due to recent surgery and does not have ride into [place].   At patient's request contacted Dr. Lorin Mercy office in [place] (Dr. Lorin Mercy will be in office tomorrow, recently performed the patient's back surgery) to see if he would fill Norco. Patient to have Dr. Lorin Mercy office destroy Percocet script which she did not fill today. Message left with Dr. Lorin Mercy office.

## 2015-03-27 NOTE — Assessment & Plan Note (Signed)
Improved s/p surgery. Patient wishes to continue Cymbalta 60 mg daily. -continue current regimen -continue to follow clinically

## 2015-03-27 NOTE — Addendum Note (Signed)
Addended by: Lupita Dawn on: 03/27/2015 10:55 AM   Modules accepted: Orders, Medications

## 2015-03-27 NOTE — Assessment & Plan Note (Addendum)
Back surgery performed on 02/27/15 (see overview). Patient doing well post-op. Pain is controlled. Incision healing well. -continue to wean Gabapentin and Roxicet as tolerated

## 2015-03-28 NOTE — Telephone Encounter (Signed)
Patient calls, states she picked up the script from Dr. Lorin Mercy and it is completely different from what she has ever taken. Sh is now very confused. Request to speak to Dr. Ree Kida.

## 2015-03-29 NOTE — Telephone Encounter (Signed)
Spoke with patient. Dr. Lorin Mercy filled [place] 5-325 (previously on 10-325). She was ok with this as she is attempting to be weaned off narcotics.

## 2015-04-11 ENCOUNTER — Telehealth: Payer: Self-pay | Admitting: Family Medicine

## 2015-04-11 NOTE — Telephone Encounter (Signed)
Pt called because she needs a refill on her Hydrocodone left up front for pick up and she would also like to speak to Dr. Ree Kida. jw

## 2015-04-12 MED ORDER — HYDROCODONE-ACETAMINOPHEN 10-325 MG PO TABS: 1.0000 | ORAL_TABLET | Freq: Four times a day (QID) | ORAL | Status: DC | PRN

## 2015-04-12 NOTE — Telephone Encounter (Signed)
Attempted to return call with no answer

## 2015-04-12 NOTE — Telephone Encounter (Signed)
Spoke with patient. Taking 2-3 Norco 5-325 per day, not lasting her through the day, would like Norco 10-325. Recently seen by Dr. Lorin Mercy for follow up of back surgery. Will refill Norco 10-325 mg today. Placed script in front office.

## 2015-04-23 ENCOUNTER — Encounter: Payer: Self-pay | Admitting: Family Medicine

## 2015-04-23 ENCOUNTER — Ambulatory Visit (INDEPENDENT_AMBULATORY_CARE_PROVIDER_SITE_OTHER): Payer: No Typology Code available for payment source | Admitting: Family Medicine

## 2015-04-23 VITALS — BP 125/73 | HR 84 | Temp 98.1°F | Ht 62.0 in | Wt 159.6 lb

## 2015-04-23 DIAGNOSIS — M5030 Other cervical disc degeneration, unspecified cervical region: Secondary | ICD-10-CM | POA: Insufficient documentation

## 2015-04-23 DIAGNOSIS — Z7189 Other specified counseling: Secondary | ICD-10-CM

## 2015-04-23 DIAGNOSIS — M5136 Other intervertebral disc degeneration, lumbar region: Secondary | ICD-10-CM

## 2015-04-23 DIAGNOSIS — M542 Cervicalgia: Secondary | ICD-10-CM

## 2015-04-23 DIAGNOSIS — G8929 Other chronic pain: Secondary | ICD-10-CM

## 2015-04-23 MED ORDER — HYDROCODONE-ACETAMINOPHEN 10-325 MG PO TABS: 1.0000 | ORAL_TABLET | Freq: Four times a day (QID) | ORAL | Status: DC | PRN

## 2015-04-23 NOTE — Patient Instructions (Signed)
It was nice to see you today.  I have ordered an MRI of your neck. My nurse will schedule this for you.   Please continue to follow up with Dr. Lorin Mercy. I have given you a refill of the Norco.

## 2015-04-23 NOTE — Progress Notes (Signed)
   Subjective:    Patient ID: Toni Chan, female    DOB: [birth year], 49 y.o.   MRN: [id number]  HPI 49 y/o female presents for follow up of back pain s/p recent back surgery.  Back pain - controlled with prn Norco 10-325, taking 1-2 per day, has been weaning down Gabapentin (currently at 300 mg BID), average pain is 6-7/10, depends on physical activity, continues to follow with Dr. Lorin Mercy, wearing back brace, incision healing well, reports increased pain down right buttock/posterior thigh that she is following up with Dr. Lorin Mercy in the next few weeks.   Neck pain - patient reports bilateral posterior neck pain with associated numbness/weakness of bilateral upper extremities (affects whole right arm/hand, ulnar distribution of the left hand). Has had symptoms since the time of her initial low back surgeries. Associated posterior headaches.   Social - in the process of applying for SS/Disability.    Review of Systems  Constitutional: Negative for fever, chills and fatigue.  Respiratory: Negative for cough and shortness of breath.   Gastrointestinal: Negative for nausea, vomiting and diarrhea.       Objective:   Physical Exam Vitals: reviewed Gen: pleasant female, NAD MSK: cervical - bilateral paraspinal neck tenderness, minimal midline tenderness, limited ROM related to pain Neuro: bilateral UE strength 5/5, sensation to light touch intact (patient feels tingling in arms with light touch), biceps/BR reflexes 2+ bilaterally Skin: midline lumbar spine incision healing well, no drainage or erythema     Assessment & Plan:  Please see problem specific assessment and plan.

## 2015-04-23 NOTE — Assessment & Plan Note (Signed)
Patient continues to show gradual improvement. However now has some right posterior buttock/thigh pain. -refill of Norco provided -increase Gabapentin back to 600 TID as suspect symptoms are related to nerve damage/impingement -patient to follow up with Dr. Lorin Mercy -return in one month for follow up

## 2015-04-23 NOTE — Assessment & Plan Note (Signed)
Refill of Norco provided.

## 2015-04-23 NOTE — Assessment & Plan Note (Signed)
Cervical neck pain with radicular symptoms. -check MRI cervical spine

## 2015-04-25 ENCOUNTER — Telehealth: Payer: Self-pay | Admitting: Family Medicine

## 2015-04-25 NOTE — Telephone Encounter (Signed)
RN staff - please call patient and inform her that I am out of town until Monday, I am happy to complete the letter then.

## 2015-04-25 NOTE — Telephone Encounter (Signed)
Patient needs letter to state that she is unable to work to continue receiving her food stamps. Will forward to PCP.

## 2015-04-25 NOTE — Telephone Encounter (Signed)
Pt calling and states that at last appt she forgot to ask for a letter to take to Social Services to continue her food stamp program because she is still unable to work. If it is possible to put on MyChart then she would prefer this method as this letter was due at the end of this month. If we must mail it she said that would be okay, she just needs it asap. Thank you, Fonda Kinder, ASA

## 2015-04-29 ENCOUNTER — Encounter: Payer: Self-pay | Admitting: Family Medicine

## 2015-04-29 NOTE — Telephone Encounter (Signed)
RN staff - please call patient and inform her that I have printed a letter and placed it in the front office for pickup.

## 2015-04-29 NOTE — Telephone Encounter (Signed)
Spoke with patient, she requests letter be mailed to her. Letter mailed today.

## 2015-05-03 ENCOUNTER — Ambulatory Visit (HOSPITAL_COMMUNITY): Admission: RE | Admit: 2015-05-03 | Payer: No Typology Code available for payment source | Source: Ambulatory Visit

## 2015-05-14 ENCOUNTER — Encounter: Payer: Self-pay | Admitting: Family Medicine

## 2015-05-15 ENCOUNTER — Telehealth: Payer: Self-pay | Admitting: *Deleted

## 2015-05-15 ENCOUNTER — Ambulatory Visit (HOSPITAL_COMMUNITY): Admission: RE | Admit: 2015-05-15 | Payer: No Typology Code available for payment source | Source: Ambulatory Visit

## 2015-05-15 NOTE — Telephone Encounter (Signed)
Toni Chan called from [facility] asking about the order for the pt MRI and after reaching the PCP he stated that there was no reason for pt to have another MRI due to the fact that a previous one covered what he was looking for. The previous one was showing just a lumbar spine in the order only, however in the resulting notes from that MRI it shows that the cervical spine was also evaluated. PCP tried to contact pt and stated that the phone said that number had either been changed, disconnected, or no longer in service.  I tried again to inform pt that she did not need to go and have the MRI that was scheduled and received the same message. Told [name] that I would try to contact pt and that if she did show up that she could be given this information or just tell her that she could contact our office for this information. [name], [name], [place]

## 2015-05-21 ENCOUNTER — Ambulatory Visit (INDEPENDENT_AMBULATORY_CARE_PROVIDER_SITE_OTHER): Payer: Self-pay | Admitting: Family Medicine

## 2015-05-21 ENCOUNTER — Encounter: Payer: Self-pay | Admitting: Family Medicine

## 2015-05-21 VITALS — BP 122/75 | HR 81 | Temp 98.4°F | Ht 62.0 in | Wt 161.0 lb

## 2015-05-21 DIAGNOSIS — M5030 Other cervical disc degeneration, unspecified cervical region: Secondary | ICD-10-CM

## 2015-05-21 NOTE — Patient Instructions (Signed)
It was nice to see you today.  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 Please follow up with Dr. Lorin Mercy.   Start Lyrica. Stop Gabapentin. Please call my office with the dose of the Lyrica.   If you do not improve in a week please call Dr. Ree Kida and I will start a muscle relaxant.

## 2015-05-21 NOTE — Assessment & Plan Note (Signed)
Patient has cervical degenerative disc disease. Patient is not currently interested in surgical interventions. She does have planned follow-up with her orthopedic surgeon Dr. Lorin Mercy later this week for follow-up of her lumbar back pain and will discuss the MRI findings with him. -Continue current regimen of Norco and Cymbalta -Patient does have Lyrica that is been delivered from [employer], will attempt a trial of Lyrica, stop gabapentin -If no relief with Lyrica would add baclofen to help relieve muscle spasm -Continue conservative therapies with heat and gentle range of motion exercises

## 2015-05-21 NOTE — Progress Notes (Signed)
   Subjective:    Patient ID: Toni Chan, female    DOB: Dec 14, 1965, 49 y.o.   MRN: [id number]  HPI 49 year old female presents for follow-up of cervical neck pain.  Patient continues to have cervical neck pain with radicular symptoms, she reports burning type sensation that radiates down bilateral arms into her hands, no associated weakness, she continues to take Norco 10-325 one to 2 tablets daily for chronic neck and low back pain, she is also currently taking Cymbalta 60 mg daily and gabapentin 600 mg 3 times a day, she has been applying ice to her neck  At the last visit patient was scheduled for an MRI of her cervical spine, and further review of her chart it was noted that a previous MRI was performed earlier in 2016 (results were embedded in lumbar spine imaging), this was ordered by her orthopedic physician Dr. Lionel December has upcoming Social Security disability hearing in October 2016   Review of Systems  Constitutional: Negative for fever, chills and fatigue.  Respiratory: Negative for cough and shortness of breath.   Cardiovascular: Negative for chest pain and leg swelling.  Gastrointestinal: Negative for nausea, vomiting and diarrhea.       Objective:   Physical Exam Vitals: Reviewed Gen.: Pleasant female, no acute distress Cardiac: Regular rate and rhythm, S1 and S2 present, no murmurs, no heaves or thrills Respiratory: Clear to station bilaterally, normal effort MSK: Bilateral paraspinal and midline cervical tenderness, bilateral cervical and trapezius muscle spasm, diminished range of motion of the cervical spine Neuro: Strength 5/5 to bilateral grip strength/arm flexion/arm extension/shoulder abduction/shoulder adduction, no deficits to light touch in the bilateral upper extremities  Reviewed MRI of the cervical spine     Assessment & Plan:  Please see problem specific assessment and plan.

## 2015-05-23 ENCOUNTER — Telehealth: Payer: Self-pay | Admitting: Family Medicine

## 2015-05-23 ENCOUNTER — Other Ambulatory Visit: Payer: Self-pay | Admitting: Family Medicine

## 2015-05-23 MED ORDER — PREGABALIN 75 MG PO CAPS
75.0000 mg | ORAL_CAPSULE | Freq: Two times a day (BID) | ORAL | Status: DC
Start: 2015-05-23 — End: 2015-06-13

## 2015-05-23 NOTE — Telephone Encounter (Signed)
Received Lyrica from pharmaceutical for 75 mg.  Need to know how much to take daily

## 2015-05-23 NOTE — Telephone Encounter (Signed)
Informed patient to take 75 mg of Lyrica BID. Stop Gabapentin.

## 2015-06-01 IMAGING — CT CT L SPINE W/ CM
3 of 8 series · 9 of 33 positions shown, 10 images · non-contrast
Comparison: MRI of 12/20/2014.

CLINICAL DATA: Lumbago/low back pain. L5-S1 posterior lumbar
interbody fusion. S1 pedicle screw in the lateral recess. Severe
sacrococcygeal pain. RIGHT lower extremity pain.
TECHNIQUE: Contiguous axial images were obtained through the Lumbar spine after
the intrathecal infusion of infusion. Coronal and sagittal
reconstructions were obtained of the axial image sets.

[Series 3: l spine 3.0 b41s · axial · 0.26mm/px · z∈[-168,-168]mm · 1 of 77 slices shown, 2 images]
[im 44/77  soft-tissue]
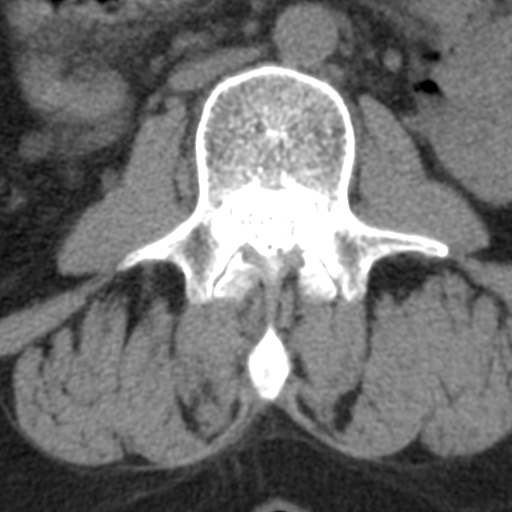
[im 44/77  bone]
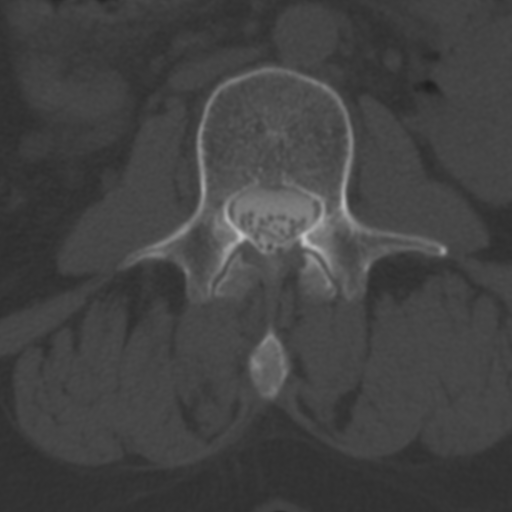

[Series 7: l spine bone cor · coronal · 0.28mm/px · 3 of 41 slices shown]
[im 9/41  bone]
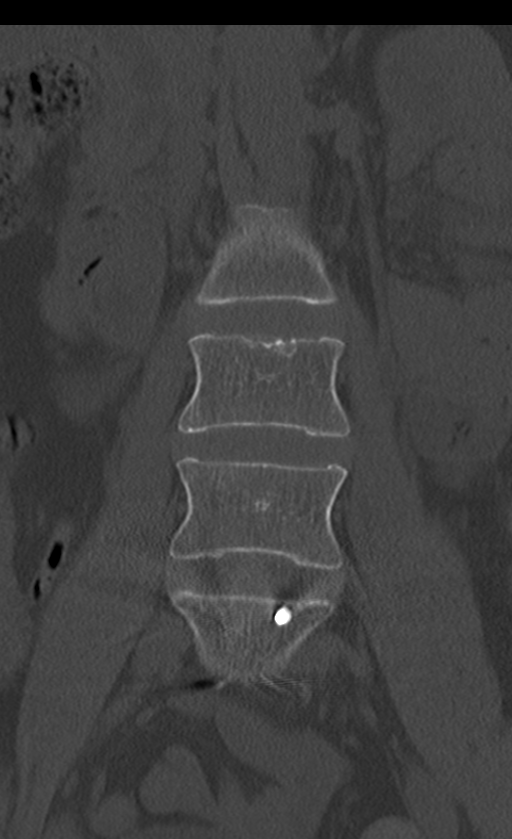
[im 17/41  bone]
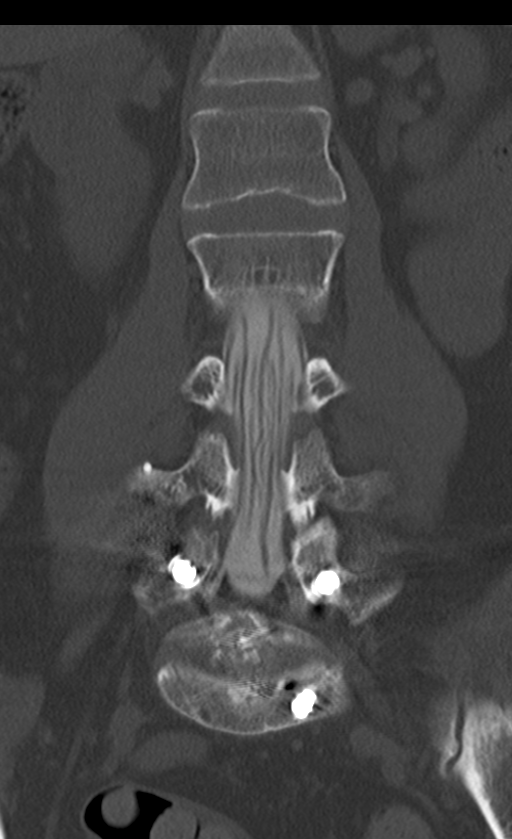
[im 25/41  bone]
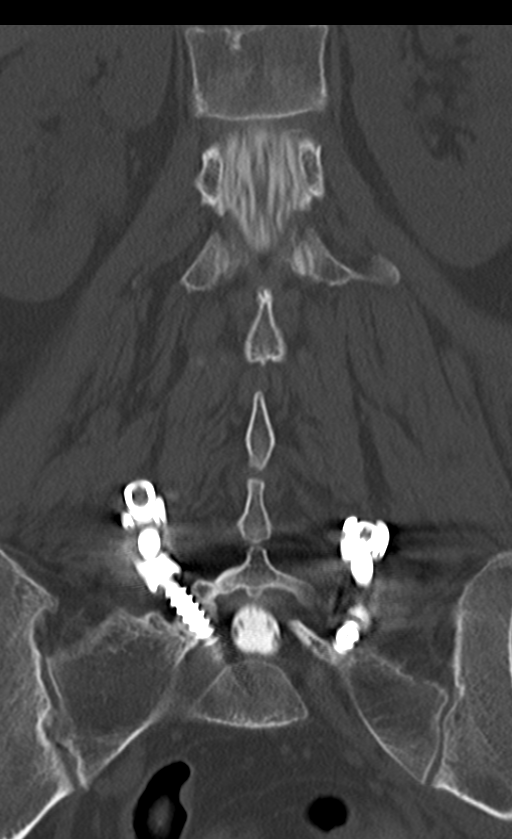

[Series 8: l spine bone · sagittal · 0.26mm/px · 5 of 35 slices shown]
[im 12/35  bone]
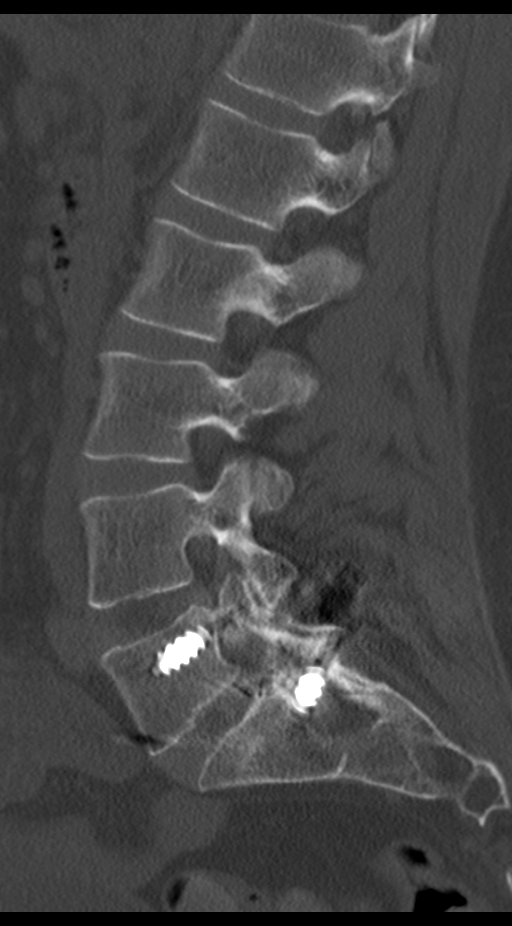
[im 15/35  bone]
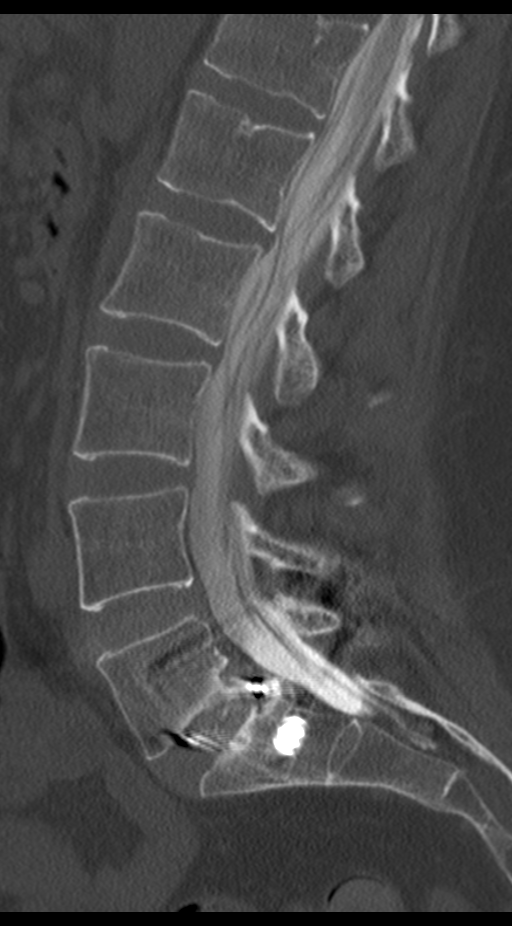
[im 18/35  bone]
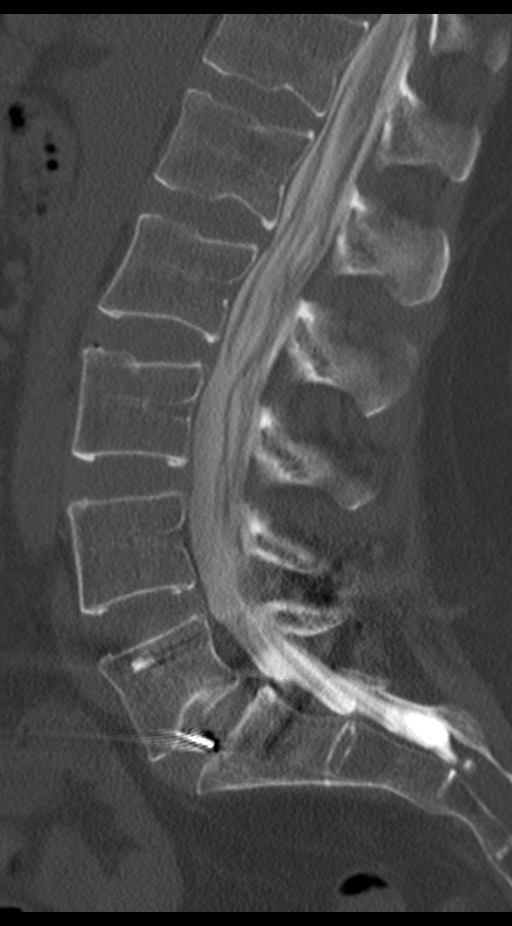
[im 20/35  bone]
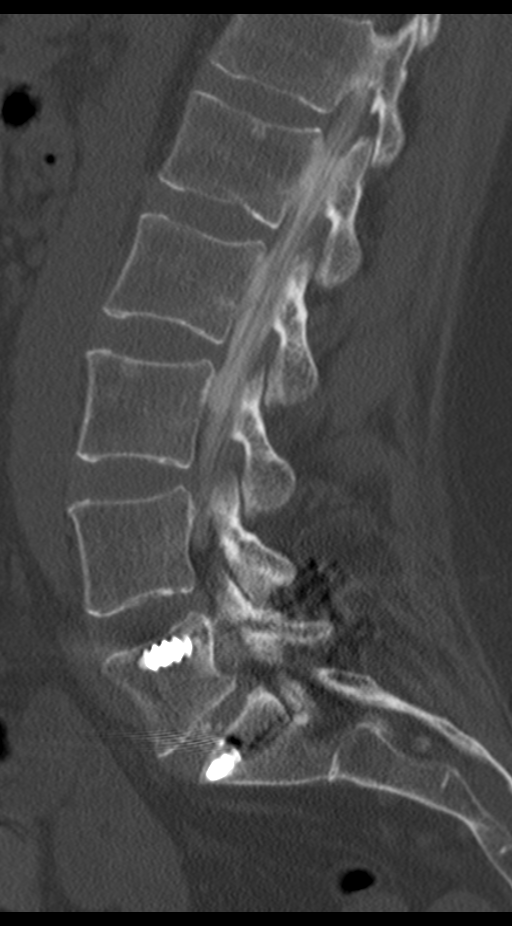
[im 23/35  bone]
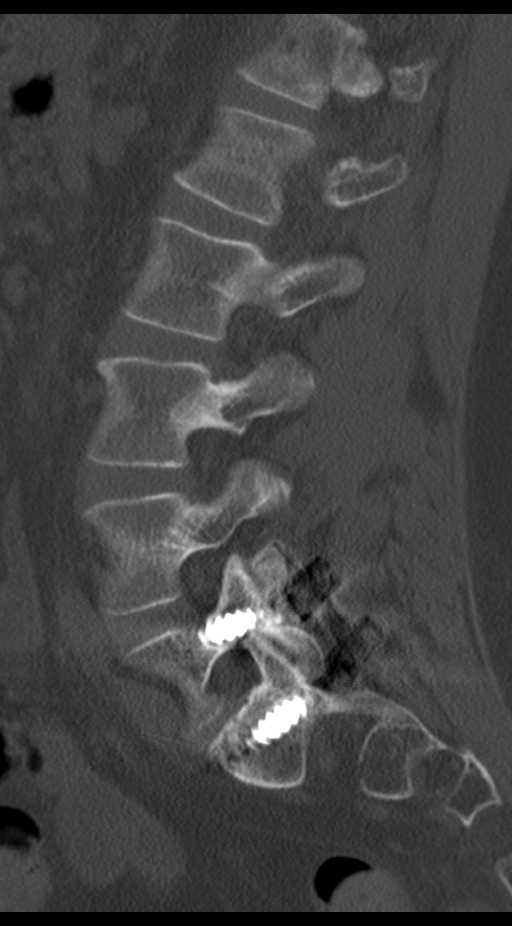

[9 of 33 positions shown; findings below may reference images not displayed]

EXAM:
LUMBAR MYELOGRAM

FLUOROSCOPY TIME:  0 minutes 53 seconds

Thirteen fluoroscopic images were obtained.

PROCEDURE:
After thorough discussion of risks and benefits of the procedure
including bleeding, infection, injury to nerves, blood vessels,
adjacent structures as well as headache and CSF leak, written and
oral informed consent was obtained. Consent was obtained by Dr.
Nic. Time out form was completed.

Patient was positioned prone on the fluoroscopy table. Local
anesthesia was provided with 1% lidocaine without epinephrine after
prepped and draped in the usual sterile fashion. Puncture was
performed at L2-L3 using a 3 1/2 inch 22 gauge pencil point Barb
spinal needle via RIGHT paramedian approach. Using a single pass
through the dura, the needle was placed within the thecal sac, with
return of clear CSF. 15 mL of Dmnipaque-5O2 was injected into the
thecal sac, with normal opacification of the nerve roots and cauda
equina consistent with free flow within the subarachnoid space.

I personally performed the lumbar puncture and administered the
intrathecal contrast. I also personally supervised acquisition of
the myelogram images.
FINDINGS: LUMBAR MYELOGRAM FINDINGS:

Exaggerated lumbar lordosis is present. L5-S1 posterior rod and
screw fixation. Medial and inferior angulation of the RIGHT S1
pedicle screw is present. This will be discussed further in
conjunction with the CT below. L5-S1 discectomy with interbody bone
graft. The posterior bone graft marker in treat on the central canal
at L5-S1. No hardware failure. Levels cranial to L5-S1 appear
normal. On the standing upright frontal views of the lumbar spine,
there is a mild levoconvex curve with the apex at L3. Suboptimal
visualization of L5-S1 on the frontal radiographs partially due to
projection and partially due to overlying hardware. No pathologic
motion with flexion and extension maneuvers.

CT LUMBAR MYELOGRAM FINDINGS:

Segmentation: The numbering convention used for this exam termed
L5-S1 as the last intervertebral disc space.

Alignment: Mild levoconvex curve with the apex at L3. Exaggerated
lordosis as noted on upright films.

Vertebrae: Normal vertebral body height. No compression fracture.
Scattered Schmorl's nodes. No destructive osseous lesion.

Conus medullaris: Normal termination at L1-L2.

Paraspinal tissues: The common bile duct is dilated for age,
measuring 9 mm on the coronal imaging. This is most commonly seen
following cholecystectomy. Correlation with bilirubin recommended,
and if normal, no further evaluation. This recommendation follows
ACR consensus guidelines: Managing Incidental Findings on Abdominal
CT: White Paper of the ACR Incidental Findings Committee. [HOSPITAL] 0363;[DATE]

Bilateral SI joint osteoarthritis is present, greater on the LEFT
when compared to the RIGHT.

Disc levels:

T11-T12 is partially visible. Intervertebral discs from T11-T12
through L4-L5 appear within normal limits for age. No disc
protrusions or stenosis at these levels. Central canal and neural
foramina appear patent. Tiny vacuum disc is present at L2-L3 and
L3-L4. Disc height is preserved at these levels.

L5-S1: RIGHT L5 laminotomy. The central canal is decompressed.
Posterior rod and pedicle screw fixation is present. The RIGHT S1
pedicle screw is malpositioned, traversing the RIGHT lateral recess
any there compressing or transecting the RIGHT S1 nerve. There does
appear to be a small amount of contrast filling the inferior RIGHT
S1 nerve root sleeve. The pedicle screw terminates in the midline of
the sacrum. The LEFT S1 pedicle screw is normal. The L5 pedicle
screws have good position and there are no complicating features.

The superior tip of the interconnecting rod on the RIGHT abuts the
posterior aspect of the RIGHT L4 transverse process and could have
impingement from motion at L4-L5.

Interbody bone graft is present at L5-S1, without bridging bone at
this time. There is some incorporation of interbody bone graft
across the posterior aspect of the disc space.

LEFT lateral recess patent. Both neural foramina appear patent. The
facet joints remain open. No loosening of the pedicle screws. There
is no evidence of a pseudoarthrosis. Pedicle screw tract is present
on the RIGHT extending through the pedicle and into the S1 vertebra
(image 62 series 4).
IMPRESSION: LUMBAR MYELOGRAM IMPRESSION:

1. Technically successful lumbar puncture at L2-L3 with pencil point
needle for lumbar myelogram.
2. L5-S1 posterior lumbar interbody fusion. No visible stenosis.
Poor visualization of the S1 nerve roots due to overlying hardware.
3. Mild levoconvex lumbar scoliosis.
CT LUMBAR MYELOGRAM IMPRESSION:

L5-S1 PLIF with malposition of the RIGHT S1 pedicle screw. The RIGHT
S1 pedicle screw traverses the RIGHT lateral recess and either
compresses or transects the RIGHT S1 nerve with effacement of
contrast within the RIGHT S1 nerve root sleeve. No evidence of
pseudoarthrosis or hardware loosening following discectomy with
interbody bone graft. The interbody bone graft is incorporating
however there is no solid bridging bone at this time.

## 2015-06-05 ENCOUNTER — Telehealth: Payer: Self-pay | Admitting: Family Medicine

## 2015-06-05 ENCOUNTER — Other Ambulatory Visit: Payer: Self-pay | Admitting: Family Medicine

## 2015-06-05 DIAGNOSIS — M5136 Other intervertebral disc degeneration, lumbar region: Secondary | ICD-10-CM

## 2015-06-05 DIAGNOSIS — M51369 Other intervertebral disc degeneration, lumbar region without mention of lumbar back pain or lower extremity pain: Secondary | ICD-10-CM

## 2015-06-05 NOTE — Telephone Encounter (Signed)
Pt calling to inform PCP that the therapist that he had her "lookung for" was not the correct one. Pt states that PCP has another option in mind. Please fu with pt. Thank you, Fonda Kinder, ASA

## 2015-06-05 NOTE — Progress Notes (Signed)
Received paperwork from [employer] (law firm handling patient's disability case). Patient will need functional capacity evaluation so that I can complete paperwork. Patient made aware of plan. Will place order for referral to OT.

## 2015-06-13 ENCOUNTER — Encounter: Payer: Self-pay | Admitting: Family Medicine

## 2015-06-13 ENCOUNTER — Ambulatory Visit (INDEPENDENT_AMBULATORY_CARE_PROVIDER_SITE_OTHER): Payer: No Typology Code available for payment source | Admitting: Family Medicine

## 2015-06-13 VITALS — BP 118/71 | HR 73 | Temp 98.2°F | Ht 62.0 in | Wt 162.0 lb

## 2015-06-13 DIAGNOSIS — M51369 Other intervertebral disc degeneration, lumbar region without mention of lumbar back pain or lower extremity pain: Secondary | ICD-10-CM

## 2015-06-13 DIAGNOSIS — M5136 Other intervertebral disc degeneration, lumbar region: Secondary | ICD-10-CM

## 2015-06-13 DIAGNOSIS — G8929 Other chronic pain: Secondary | ICD-10-CM

## 2015-06-13 DIAGNOSIS — M5030 Other cervical disc degeneration, unspecified cervical region: Secondary | ICD-10-CM

## 2015-06-13 DIAGNOSIS — R21 Rash and other nonspecific skin eruption: Secondary | ICD-10-CM

## 2015-06-13 DIAGNOSIS — Z7189 Other specified counseling: Secondary | ICD-10-CM

## 2015-06-13 HISTORY — DX: Rash and other nonspecific skin eruption: R21

## 2015-06-13 MED ORDER — GABAPENTIN 600 MG PO TABS: 600.0000 mg | ORAL_TABLET | Freq: Three times a day (TID) | ORAL | Status: DC

## 2015-06-13 MED ORDER — HYDROCORTISONE 2.5 % EX OINT: TOPICAL_OINTMENT | Freq: Two times a day (BID) | CUTANEOUS | Status: DC

## 2015-06-13 MED ORDER — TRAMADOL HCL 50 MG PO TABS: 50.0000 mg | ORAL_TABLET | Freq: Three times a day (TID) | ORAL | Status: DC | PRN

## 2015-06-13 NOTE — Assessment & Plan Note (Signed)
Stable. Still reports right leg/hip pain -switch Lyrica back to Gabapentin due to rash -attempt trial of Tramadol (to replace multiple doses of Norco) -wean Norco -continue Cymbalta for pain -if no improvement patient will need to return to Dr. Lorin Mercy

## 2015-06-13 NOTE — Assessment & Plan Note (Addendum)
Stable. Still reports right arm numbness/weakness.  -switch Lyrica back to Gabapentin due to rash -attempt trial of Tramadol (to replace multiple doses of Norco -wean Norco -continue Cymbalta for pain -if no improvement patient will need to return to Dr. Lorin Mercy

## 2015-06-13 NOTE — Patient Instructions (Addendum)
It was nice to see you again.  Rash - please go and get the Hydrocortisone cream, apply lotion/cream daily (I like Eucerin cream)  Pain - continue Norco as needed, lets attempt trial of Tramadol; return to using the gabapentin  Please call occupational therapy to schedule an appointment [phone])  Please return in one month

## 2015-06-13 NOTE — Progress Notes (Signed)
   Subjective:    Patient ID: Toni Chan, female    DOB: [birth year], 49 y.o.   MRN: [id number]  HPI 49 y/o female presents for follow up of chronic pain. PMH of multiple back surgeries/lumbar DDD/cervical DDD.  Patient reports improvement in pain with Lyrica, however developed rash on bilateral knees (thinks related to Lyrica), stopped Lyrica and rash improving, still taking 3 Norco per day, she is interested in returning to Gabapentin and supplementing with Tramadol (to decrease Norco intake). Following with Dr. Lorin Mercy, at last visit told to return in 3 months. Neck and back pain are unchanged (see previous notes for further information).  Rash - bilateral knees, improved with stopping Lyrica and otc hydrocortisone cream, mild puritis present, no drainage   Review of Systems  Constitutional: Negative for fever, chills and fatigue.  Respiratory: Negative for cough and choking.   Cardiovascular: Negative for chest pain and leg swelling.  Gastrointestinal: Negative for nausea, abdominal pain and diarrhea.  Skin: Positive for rash.       Objective:   Physical Exam Vitals: reviewed Gen: pleasant female, NAD Cardiac: RRR, S1 and S2 present, no murmur Resp: CTAB, normal effort Skin: scaling maculopapular rash over bilateral knees, rash not present on other skin surfaces       Assessment & Plan:  Please see problem specific assessment and plan.

## 2015-06-13 NOTE — Assessment & Plan Note (Signed)
Rash over anterior knees consistent with eczema or psoriasis (no other rash sites present). Possible drug reaction to Lyrica. -change Lyrica to Gabapentin -treat with Hydrocortisone cream 2.5% -if persists consider punch biopsy and workup for other rheumatologic disorders

## 2015-06-19 ENCOUNTER — Ambulatory Visit (HOSPITAL_COMMUNITY): Payer: No Typology Code available for payment source

## 2015-06-19 ENCOUNTER — Telehealth: Payer: Self-pay | Admitting: Family Medicine

## 2015-06-19 NOTE — Telephone Encounter (Signed)
Pt called and said that the OT that the doctor set up will not take her financial program for payment. Can we see if Tia can find a place where she can go? She would also like to speak to Dr. Ree Kida. jw

## 2015-06-19 NOTE — Telephone Encounter (Signed)
OT - spoke with referral coordinator, patient would need to pay out of pocket at [place], will send referral to [facility] OT in [place] to see if it will be covered  Back pain - patient "twisted" funny over the weekend, has had worsening back pain with swelling, slightly better over the pat few days, will continue Tramadol and Gabapentin, patient to make appointment if symptoms persist

## 2015-06-21 NOTE — Telephone Encounter (Signed)
Rehab center have contacted [name] and everything has cleared.

## 2015-06-24 ENCOUNTER — Telehealth: Payer: Self-pay | Admitting: Family Medicine

## 2015-06-24 NOTE — Telephone Encounter (Signed)
I attempted to return the call to [employer]. The number listed under the call section was a fax #.  Kennyth Lose - did they leave a specific number to call back on

## 2015-06-24 NOTE — Telephone Encounter (Signed)
Outside firm is calling to check the status of forms they faxed for the patient about disability. Please fax back when they are filled out. jw

## 2015-06-25 ENCOUNTER — Ambulatory Visit (HOSPITAL_COMMUNITY): Payer: No Typology Code available for payment source | Attending: Family Medicine | Admitting: Specialist

## 2015-06-25 DIAGNOSIS — M25521 Pain in right elbow: Secondary | ICD-10-CM

## 2015-06-25 DIAGNOSIS — Z789 Other specified health status: Secondary | ICD-10-CM

## 2015-06-25 DIAGNOSIS — M79621 Pain in right upper arm: Secondary | ICD-10-CM | POA: Insufficient documentation

## 2015-06-25 DIAGNOSIS — R29898 Other symptoms and signs involving the musculoskeletal system: Secondary | ICD-10-CM

## 2015-06-25 DIAGNOSIS — Z658 Other specified problems related to psychosocial circumstances: Secondary | ICD-10-CM | POA: Insufficient documentation

## 2015-06-25 NOTE — Therapy (Signed)
[facility] [place], [place], [address] Phone: [phone]   Fax:  [phone]  Occupational Therapy Evaluation  Patient Details  Name: Toni Chan MRN: [id number] Date of Birth: [birth year] Referring Provider:  Lupita Dawn, MD  Encounter Date: 06/25/2015      OT End of Session - 06/25/15 1420    Visit Number 1   Number of Visits 16   Date for OT Re-Evaluation 08/24/15  mini reassess on 07/22/15   Authorization Type [employer] discount - therapy approved   OT Start Time 1100   OT Stop Time 1150   OT Time Calculation (min) 50 min   Activity Tolerance Patient tolerated treatment well   Behavior During Therapy [facility] for tasks assessed/performed      Past Medical History  Diagnosis Date  . IBS (irritable bowel syndrome)   . Chronic back pain   . Cervical cancer 1989    Vaginal hysterectomy and oophorectomy 1993  . Kidney stones 2014  . Renal cyst 2014  . Endometriosis   . GERD (gastroesophageal reflux disease)   . Superficial thrombophlebitis   . Placental abruption   . Pilonidal cyst 2015  . Pneumonia   . Anxiety   . Depression   . Arthritis   . Hx MRSA infection 2014    in the area of the scar fr. having coccyx removal    Past Surgical History  Procedure Laterality Date  . Coccyx removal  2014  . Cesarean section  1992  . Back surgery  2013, 2014 X2  . Abdominal hysterectomy      laparoscopic  . Tonsillectomy    . Colonoscopy    . Lumbar fusion  02/27/2015    L4      There were no vitals filed for this visit.  Visit Diagnosis:  Right arm weakness - Plan: Ot plan of care cert/re-cert  Pain in joint, upper arm, right - Plan: Ot plan of care cert/re-cert  Decreased independence with activities of daily living - Plan: Ot plan of care cert/re-cert         [facility] OT Assessment - 06/25/15 0001    Assessment   Diagnosis Right Arm Numbness and Weakness   Onset Date 09/28/10   Prior Therapy none   Precautions   Precautions None   Restrictions   Weight Bearing Restrictions No   Balance Screen   Has the patient fallen in the past 6 months Yes   How many times? 1   Has the patient had a decrease in activity level because of a fear of falling?  Yes   Is the patient reluctant to leave their home because of a fear of falling?  Yes   Home  Environment   Family/patient expects to be discharged to: Private residence   Lives With Alone   Prior Function   Level of [place] with basic ADLs;Independent  takes increased time to complete   Vocation Unemployed   Leisure no leisure activities   ADL   ADL comments difficulty opening containers or cooking so just eats peanutbutter sandwich, would like to cook more.  Pain limits her activity level and she would like to be able to complete activities with less pain.  Patient has issues with bowel movements.  Patient does drive and complete light cleaning   Written Expression   Dominant Hand Right   Vision - History   Baseline Vision No visual deficits   Cognition   Overall Cognitive Status  Within Functional Limits for tasks assessed   Observation/Other Assessments   Focus on Therapeutic Outcomes (FOTO)  32/100   Sensation   Light Touch Appears Intact  light touch sensation is intact in BUE   Coordination   Gross Motor Movements are Fluid and Coordinated Yes   Fine Motor Movements are Fluid and Coordinated Yes   9 Hole Peg Test Right;Left   Right 9 Hole Peg Test 22.67   Left 9 Hole Peg Test 24.84"   ROM / Strength   AROM / PROM / Strength AROM;PROM;Strength   AROM   Overall AROM Comments BUE AROM is WFL, shoulders are limited in flexion and abduction to 50% range.     PROM   Overall PROM Comments PROM is WFL in BUE   Strength   Overall Strength Comments Strength is 5/5 in LUE and 4-/5 in RUE   Hand Function   Right Hand Grip (lbs) 33   Right Hand Lateral Pinch 10 lbs   Right Hand 3 Point Pinch 4 lbs   Left Hand Grip  (lbs) 40   Left Hand Lateral Pinch 9 lbs   Left 3 point pinch 4 lbs                         OT Education - 06/25/15 1419    Education provided Yes   Education Details Educated patient on use of theraputty for right hand grip strengthening and gentle stretches for bilateral shoulders   Person(s) Educated Patient   Methods Explanation;Demonstration;Handout   Comprehension Verbalized understanding          OT Short Term Goals - 06/25/15 1431    OT SHORT TERM GOAL #1   Title Patient will be educated on an HEP.   Time 4   Period Weeks   Status New   OT SHORT TERM GOAL #2   Title Patient will improve bilateral shoulder flexion and abduction to 60% range for increased ability to reach overhead when putting clothing away.   Time 4   Period Weeks   Status New   OT SHORT TERM GOAL #3   Title Patient will improve right arm strength to 4/5 for increased ability to participate in daily tasks.   Time 4   Period Weeks   Status New   OT SHORT TERM GOAL #4   Title Patient will improve right hand grip strength by 3 pounds or more for increased ability to open containers when cooking.    Time 4   Period Weeks   Status New   OT SHORT TERM GOAL #5   Title Patient will be educated on energy conservation techniques.   Time 4   Period Weeks   Status New   Additional Short Term Goals   Additional Short Term Goals Yes   OT SHORT TERM GOAL #6   Title Patient will be educated on strategies to improve ease with bowel movements.   Time 4   Period Weeks   Status New           OT Long Term Goals - 06/25/15 1435    OT LONG TERM GOAL #1   Title Patient will complete B/IADLs at highest level of independence possible.   Time 8   Period Weeks   Status New   OT LONG TERM GOAL #2   Title Patient will improve bilateral shoulder flexion and abduction to 75% range for increased ability to reach overhead when putting clothing away.  Time 8   Period Weeks   Status New   OT  LONG TERM GOAL #3   Title Patient will improve right arm strength to 4+/5 for increased ability to participate in daily tasks.   Time 8   Period Weeks   Status New   OT LONG TERM GOAL #4   Title Patient will improve right hand grip strength by 8 pounds or more for increased ability to open containers when cooking.    Time 8   Period Weeks   Status New   OT LONG TERM GOAL #5   Title Patient will independently apply energy conservation techniques to her daily tasks.   Time 6   Period Weeks               Plan - 06/25/15 1422    Clinical Impression Statement A:  Patient is a 49 year old with history that includes several back surgeries, among multiple other medical problems.  She reports that she is in constant pain, and that her pain level limits the amount of participation in daily activities.  Pain level and weakness in her right upper extremity and hand further limit her participation in daily activities.    Pt will benefit from skilled therapeutic intervention in order to improve on the following deficits (Retired) Decreased range of motion;Decreased strength;Impaired UE functional use;Pain   Rehab Potential Fair   OT Frequency 2x / week   OT Duration 8 weeks   OT Treatment/Interventions Self-care/ADL training;Electrical Stimulation;Energy conservation;Therapeutic exercise;Manual Therapy;Therapeutic activities;Patient/family education   Plan P:  Skilled OT intervention to decrease pain and improve mobility and strength in RUE so that she can improve her independence and participation in daily activities.  Next session:  general upper extremity AROM, grip strengthening, interview further regarding bowel movement issues and formulate an education packet regarding this subject for the patient.    Consulted and Agree with Plan of Care Patient        Problem List Patient Active Problem List   Diagnosis Date Noted  . Rash and nonspecific skin eruption 06/13/2015  . DDD  (degenerative disc disease), cervical 04/23/2015  . S/P lumbar fusion 02/27/2015  . Encounter for chronic pain management 01/02/2015  . Hypercholesteremia 12/04/2014  . Lumbar degenerative disc disease 12/03/2014  . Preventative health care 12/03/2014  . Endometriosis 12/03/2014  . GERD (gastroesophageal reflux disease) 12/03/2014  . Depression 12/03/2014    Vangie Bicker, OTR/L [phone]  06/25/2015, 2:42 PM  [facility] [address] [place], [place], [address] Phone: [phone]   Fax:  [phone]

## 2015-06-25 NOTE — Patient Instructions (Signed)
Home Exercises Program Theraputty Exercises  Do the following exercises 2 times a day using your affected hand.  1. Roll putty into a ball.  2. Make into a pancake.  3. Roll putty into a roll.  4. Pinch along log with first finger and thumb.   5. Make into a ball.  6. Roll it back into a log.   7. Pinch using thumb and side of first finger.  8. Roll into a ball, then flatten into a pancake.  9. Using your fingers, make putty into a mountain. SHOULDER: Flexion On Table   Place hands on table, elbows straight. Move hips away from body. Press hands down into table. Hold 5__ seconds. __10_ reps per set, _2__ sets per day, _7__ days per week

## 2015-06-26 ENCOUNTER — Ambulatory Visit (HOSPITAL_COMMUNITY): Payer: No Typology Code available for payment source | Admitting: Occupational Therapy

## 2015-06-28 ENCOUNTER — Telehealth: Payer: Self-pay | Admitting: Family Medicine

## 2015-06-28 NOTE — Telephone Encounter (Signed)
Pt called and would like to speak to Dr. Ree Kida about her new pain. She is having left side pain that is very sharp and additional pains at other parts of the body. jw

## 2015-07-01 NOTE — Telephone Encounter (Signed)
Spoke with patient she states she only wants to see PCP, next available appointment not until 10/21. Informed patient that I would check with PCP to see if it was ok to double book on 10/6.

## 2015-07-01 NOTE — Telephone Encounter (Signed)
RN staff - please contact patient and inform her that I am out of the office until this coming Thursday. Please offer appointment. Thanks.

## 2015-07-02 ENCOUNTER — Other Ambulatory Visit: Payer: Self-pay | Admitting: Family Medicine

## 2015-07-02 DIAGNOSIS — G8929 Other chronic pain: Secondary | ICD-10-CM

## 2015-07-02 NOTE — Telephone Encounter (Signed)
Pt ok'd to be double booked on 10/7 at 8:30, says she will be out of her tramadol, only has enough for 2 days. Pt wants to know if she can get a new rx to last until Friday? Her current rx is not due to be refilled until 10/15. Pt is in severe pain. Goes to [facility] in [place].

## 2015-07-02 NOTE — Telephone Encounter (Signed)
Note to RN staff - please do not double book on 10/6, ok to double book on 10/7 at 8:45 (during first circumcision).

## 2015-07-03 ENCOUNTER — Encounter (HOSPITAL_COMMUNITY): Payer: No Typology Code available for payment source | Admitting: Occupational Therapy

## 2015-07-03 MED ORDER — TRAMADOL HCL 50 MG PO TABS: 50.0000 mg | ORAL_TABLET | Freq: Three times a day (TID) | ORAL | Status: DC | PRN

## 2015-07-03 NOTE — Telephone Encounter (Signed)
RN staff - please call in Tramadol 50 mg Q 8 hours prn pain, dispense # 9, refill #0, please also call patient to make her aware of short term supply

## 2015-07-03 NOTE — Telephone Encounter (Signed)
Rx called in, Patient informed, expressed understanding.

## 2015-07-04 ENCOUNTER — Ambulatory Visit (HOSPITAL_COMMUNITY): Payer: No Typology Code available for payment source | Attending: Family Medicine | Admitting: Physical Therapy

## 2015-07-04 DIAGNOSIS — M5441 Lumbago with sciatica, right side: Secondary | ICD-10-CM | POA: Insufficient documentation

## 2015-07-04 NOTE — Therapy (Signed)
[place], [place], [address] Phone: [phone]   Fax:  [phone]  Physical Therapy Evaluation  Patient Details  Name: Toni Chan MRN: [id number] Date of Birth: [birth year] Referring Provider:  Lupita Dawn, MD  Encounter Date: 07/04/2015      PT End of Session - 07/04/15 1218    Visit Number 1   Number of Visits 1   PT Start Time 0800   PT Stop Time 1145   PT Time Calculation (min) 225 min   Activity Tolerance Patient limited by pain   Behavior During Therapy Anxious      Past Medical History  Diagnosis Date  . IBS (irritable bowel syndrome)   . Chronic back pain   . Cervical cancer 1989    Vaginal hysterectomy and oophorectomy 1993  . Kidney stones 2014  . Renal cyst 2014  . Endometriosis   . GERD (gastroesophageal reflux disease)   . Superficial thrombophlebitis   . Placental abruption   . Pilonidal cyst 2015  . Pneumonia   . Anxiety   . Depression   . Arthritis   . Hx MRSA infection 2014    in the area of the scar fr. having coccyx removal    Past Surgical History  Procedure Laterality Date  . Coccyx removal  2014  . Cesarean section  1992  . Back surgery  2013, 2014 X2  . Abdominal hysterectomy      laparoscopic  . Tonsillectomy    . Colonoscopy    . Lumbar fusion  02/27/2015    L4      There were no vitals filed for this visit.  Visit Diagnosis:  Midline low back pain with right-sided sciatica    Physical Work Performance EvaluationTM [facility] [address] [place]  [address] Phone [phone]  Fax [phone]  Name: Toni Chan  Injury/Onset Date: 09/15/2011  Surgery, Date: Spine Fusion-02/2015, Discectomy-02/2012, Other-08/2013  Evaluation Date: 07/04/2015  Test Start, End, Duration: 8:10 AM, 11:50 AM, 3:39 hours  Diagnosis: HNP of Lumbar with surgery to correct; ultimate fusion  Height, Weight: 5'4", 160lb  Starting BP,  HR, Pain: 122/75, 82 bpm, Pain 5 out of 10  This report summarizes the results of the [place] Physical Work Education officer, environmental.  This evaluation is substantiated by reliability and validity research conducted at the [facility] [place] and reported in the Journal of [place], September 199411  Gentry Roch, et al. Journal of Occupational Medicine. September 1994 Volume 36, No. 9: pages [phone].   .  Tolerance for the 8-Hour Day: Based on this evaluation, the client is capable of performing the tasks listed in the Task Performance Summary table.      Self Limiting Behavior: The client self-limited on 13% of the 16 tasks.   Self-limiting behavior means that the client stopped the task before a maximum effort was reached.  Possible causes of self-limiting behavior include: (1) pain; (2) psychosocial issues such as fear of reinjury, anxiety, or depression; and/or (3) attempts to manipulate test results.  Although it is difficult to determine the causes of self-limiting behavior, our research indicates that motivated clients self-limit on no more than 20% of test items.  If the self-limiting exceeds 20%, then psychosocial and/or motivational factors are affecting test results.    . Self Limiting < 20% of tasks = Within normal limits1 . Self-Limiting 21% to 33% of tasks = Exceeds normal limits1 .  Self-Limiting > 33% of tasks = Significantly exceeds normal limits1 1When compared to a motivated group of patients who participated in research.      RESULTS OF FORMAL CONSISTENCY OF EFFORT TESTING  . The [employer] [facility] utilizes a formal consistency of effort protocol established and validated by Stokes et al.2  In this protocol, three (3) different statistical calculations on grip strength testing data are performed.  These results are then combined with any evidence of clinical inconsistencies or self-limiting behavior observed during the [place].  The final  consistency of effort conclusion indicates the strength of all of this evidence combined.  . Combining the results of the clinical consistency comparisons, the presence of self-limiting behavior and the three formal consistency cross comparisons of the grip strength data, indicates that there is moderate evidence of low effort and inconsistent behavior.  See addendum for details of the formal consistency of effort scoring criteria.   SUBJECTIVE PAIN STATEMENTS  The client did not make any subjective pain statements during the test.  PAIN BEHAVIORS AND THEIR IMPACT ON TEST RESULTS  The client did not demonstrate any pain behaviors during the test.  OTHER EXTERNAL FACTORS THAT MIGHT IMPACT TEST RESULTS  No external factors noted that might impact test results.   BODY MECHANICS AND MOVEMENT PATTERNS  The client demonstrated safe body mechanics and movement patterns during the test.  BRIEF SUMMARY OF MEDICAL HISTORY  Pt states that in 2012 she was fell on the ice causing injury to her back.  she has had four back surgeries since then; one in 2013; 2 in 2014  and one in June of 2016.  She has anxiety as welll as depression.  She has had her coccyx removed in 2014.    The pt began having neck pain in 2014.  She has not had surgery on her neck.  MEDICATIONS   Medication Dose Frequency Last Dose Taken  duloxetin 60 mg qd 07/03/2015  gabapentin 600 mg three times a day 07/03/2015  trameadol 50 mg three times a day 07/03/2015     BRIEF MUSCULOSKELETAL SCREEN  . PT demonstrates functional but not normal ROM of UE with verbal and facial expression of discomfort with all motions.  PT strength Rt UE generally 3/5 with LT 4-/5; again pt has verbal, facial and body gestrures indicative of increased discomfort throughout  mm test. .  . LE: functional ROM with pt "jumping" with pain .  Pt strength:  3/5 B with no resistance given TEST LENGTH AND REST BREAKS  The test lasted 3:39 hours.  Short pauses of  2-3 minutes between tasks occur while the evaluator is setting up equipment and documenting scores.  No additional rest breaks were taken.   TASK PERFORMANCE  Please note that the results noted below in red italics and marked SL are tasks on which the client self-limited.  On these self-limiting tasks, the performance represents a minimal rather than a maximal performance.  On these tasks we cannot determine a maximum performance. Tasks Client Performance 1 Self-Limiting Reasons Maximum Abilities 1  Floor to waist lift 5 lb Occas.  5 lb Occas.  Waist to eye Chan lift 5 lb Occas.  5 lb Occas.  Two handed carrying 8 lb Occas.  8 lb Occas.  One handed carrying L5 lb Occ  L5 lb Occ  Pushing 18 lb Occas.-SL 3 Pain ? 3  Pulling 12 lb Occas. 3  12 lb Occas. 3  Sitting Frequently   Frequently  Standing  Occasionally   Occasionally  Work arms over head-standing Occasionally  Occasionally  Work bent over-standing/stooping Never  Never  Work bent over-sitting Unwilling-SL Pain ?  Work squatting/crouching Unable  Unable  Climbing stairs  Never  Never  Walking Never 5  Never  Repetitive trunk rotation-sitting Never  Never  Repetitive trunk rotation-standing Occasionally  Occasionally  Balance on Chan surfaces Adequate  Adequate  Balance on uneven surfaces Adequate  Adequate  Balance on beam/scaffold Inadequate  Inadequate  Grip Strength L20  R16 lb  L20  R16 lb   1 Occasionally = up to 1/3 of the day, Frequently = 1/3 to 2/3 of the day, Constantly = 2/3 to the full day.  Frequent lifting = 50% of Occasional; Constant lifting = 20%  of Occasional. 2 D.O.T. The aptitudes:  1 (90-100 percentile), 2 (67-89 percentile), 3 (34-66 percentile), 4 (11-33 percentile), 5 (0-10 percentile). 3 Pounds of force is the amount of force the client exerted during the pushing and pulling tasks.  If pushing or pulling is required for work, the force required for the task should be measured with a force gauge for  comparison. 5 A never score for walking does not mean that the client literally can never walk.  The client may be able to walk for brief periods but is not able to sustain this for up to 1/3 of the day as required for the "Occasional" category.      JOB SPECIFIC TESTING  . none  MAJOR AREAS OF DYSFUNCTION  . Dynamic Strength . Position Tolerance . Mobility  FACTORS UNDERLYING PERFORMANCE  . Decreased muscle strength in core and LE . Decreased range of motion in low back . Generalized de-conditioning . Pain in low back and right shoulder . Generalized fatigue . Self-Limiting behavior . Fear of re-injury  EXIT INTERVIEW  . Changes in Musculoskeletal Status from beginning to end: no significant changes . Pain Score: 5 . Gait pattern leaving the evaluation is the same compared to gait pattern used upon arriving for test. . The client drove herself to the evaluation. . client thanked evaluating therapist.     Evaluator: Leeroy Cha PT Phone: [phone]    Consistency of Effort Testing and Conclusion The [employer] F[facility] utilizes a formal consistency of effort protocol established and validated by Stokes et al.2   In this protocol, three (3) different statistical calculations on grip strength testing data are performed.  These results are then combined with any evidence of clinical inconsistencies or self-limiting behavior observed during the [place].  The final consistency of effort conclusion indicates the strength of all of this evidence combined.   Statistical Test 1.  Right Hand The standard deviation of sustained maximum right grip strength across 5 handle positions was 5.67, indicating a relatively flat bell shaped curve and low effort on the bell shaped curve test for the right hand.2   Left Hand  The standard deviation of sustained maximum left grip strength across 5 handle positions was 7.84, indicating a normal bell shaped curve and maximum effort on  the bell shaped curve test for the left hand.2     Statistical Test 2.   The Rapid Exchange Grip (REG) is 15 pounds different from the peak slow sustained grip.  Clinical studies demonstrate2 that this difference is significant and provides evidence of low effort.    Statistical Test 3.   A regression analysis was calculated based on the peak effort of sustained grip and the maximum REG.  This calculation indicates that the patient gave a low effort on grip strength tests.2    Self-Limiting Behavior Self-Limiting behavior was ? 20%.  Clinical Inconsistencies No additional significant clinical inconsistencies were noted during the FCE.  Conclusion Regarding Consistency of Effort.  Combining the results of the clinical consistency comparisons, the presence of self-limiting behavior and the three formal consistency cross comparisons of the grip strength data, indicates that there is moderate evidence of low effort and inconsistent behavior.2   2Stokes, HM et al.  Identification of Low-effort Patients Through Dynamometry.  Journal of Hand Surgery. Vol 20A, No 6, November, 1995, pp. 1047 - 1055.      DATA DETAILS   Note: For each task, client participation is rated as:  Appropriate - Client and therapist agree on stopping task. Full, physical effort given. Overextending - Therapist stops task. Client willing to continue despite maximum being reached. Full, physical effort given. Self-limiting - Client stops task before objective signs indicate that a maximum physical effort has been reached.   Lift - Floor to Waist - Over-extending . Completed 5 lb safely . Pain Score = 7.00 . Pain Location = all over . Ending HR = 30 Signs of Effort . Face Red/Perspiration . Accessory Muscles . Hands Slip/Difficulty Holding Box . Decreased Box Control . Increased Time to Complete Repetitions . Vertical Trunk Alignment Decreases . Increased Thoracic Kyphosis with Protraction of the Shoulder  Girdle . pt kneels on floor stating she can not return without assist of her hands.  Climbing Stairs - Appropriate . Completed 24 of 100 - 24% of task . Pain Score = 7.00 . Pain Location = Lt hip . Ending HR = 111 . Severe Deviations  o Takes One Step at a Time Rather than One Foot Over the Other  o Pulls Self Up with Arms  o Antalgic Gait  o Decreased Hip Flexion  o Decreased Ankle Dorsiflexion  o Decreased Ankle Plantar Flexion  Lift - Waist Height to Eye Chan - Appropriate . Completed 5 lb safely . Pain Score = 6.00 . Pain Location = Rt leg/ Rt slhouder . Ending HR = 95 Signs of Effort . Accessory Muscles . Post Trunk Lean . Hands Slip/Difficulty Holding Box . Decreased Box Control . Increased Time to Complete Repetitions . Upper Trap Elevation . Box Resting Against Chest  Bilateral Carry - Over-extending . Completed 8 lb safely . Pain Score = 5.00 . Pain Location = low back . Ending HR = 88 Signs of Effort . Post Trunk Lean . Irregular Gait . Increased Time to Complete Repetitions . Shorter Steps . Increased Knee Flexion During Carry  Unilateral Carry - Appropriate . Left completed 5 lb safely . Pain Score = 5.00 . Pain Location = low back . Ending HR = 89 Signs of Effort . Accessory Muscles . Lateral Trunk Lean . Irregular Gait . Cadence Quickens  Maximum Dynamic Pushing - Self-Limiting . Completed 18 lb safely . Pain Score = 5.00 . Pain Location = Rt shoulder . Ending HR = 78 Signs of Effort . Forward Lean Increases . Increased Time to Complete Repetitions  Maximum Dynamic Pulling - Appropriate . Completed 12 lb safely . Pain Score = 6.00 . Pain Location = low back . Ending HR = 103 Signs of Effort . Accessory Muscles . Increased Time to Complete Repetitions . rest break  Sitting Tolerance - Appropriate . Completed 5:00 of 5:00 minutes - 100% of task . Position Adjustments = 2 . Pain  Score   1 Min = 5 . Pain Location   1 Min = Low  back . Within Normal Limits  o shoulders elevated squeezing hands together in lap with squinted facial appearance.  Standing Tolerance - Appropriate . Completed 5:00 of 5:00 minutes - 100% of task . Position Adjustments = 3 . Pain Score   1 Min = 6 . Pain Location   1 Min = Mid spine to cervical area . Moderate Deviations  o Lateral trunk Shift (Right or Left)  o Head side bent to right; Righ arm in protected position  Work Arms Overhead & Standing - Appropriate . Completed 3:00 of 5:00 minutes - 60% of task . Position Adjustments = 2 . Pain Score   1 Min = 6  3 Min = 8 . Pain Location   1 Min = Spine  3 Min = Spine . Severe Deviations  o Decreased Neck Extension  o Decreased Lumbar Extension  o crying  Work Lexicographer Over / Stooping - Appropriate . Completed 4:00 of 5:00 minutes - 80% of task . Position Adjustments = 5 . Pain Score   1 Min = 6  3 Min = 6  5 Min = 6 . Pain Location   1 Min = Spine  3 Min = Spine  5 Min = Spine . Severe Deviations  o Decreased Weight Bearing on One Leg  o Lateral Trunk Shift (Right or Left)  o Increased Hip and Knee Flexion (Bilateral or Unilateral)  o Using Table to Support Thighs  o moaning ; very difficult for pt to get our of position.  After test patient is breathing fast, crying  Work Lexicographer Over Sitting - Self-Limiting . Unwilling  Squatting - Appropriate . Unable  Walking - Appropriate . Completed 226 of 500 - 45% of task . Pain Score = 6.00 . Pain Location = back . Ending HR = 85 . Severe Deviations  o Antalgic Gait  o Decreased Hip Extension  o Decreased Ankle Dorsiflexion  o Decreased Ankle Plantar Flexion  o Pt verbal and facial expression of pain ; ambulating at a slow pace  Repetitive Trunk Rotation - Sitting - Appropriate . Completed 7 of 25 - 28% of task . Pain Score = 6.00 . Pain Location = arms; shoulders . Ending HR = 84 . Severe Deviations  o Decreased Trunk Rotation  o Decreased Elbow Extension  o increased  time to complete task; facial expression of pain  Repetitive Trunk Rotation - Standing - Appropriate . Completed 16 of 25 - 64% of task . Pain Score = 6.00 . Pain Location = legs,hips arms shoulders . Ending HR = 90 . Severe Deviations  o Decreased Trunk Rotation  o Decreased Weight Shift  o Decreased Elbow Extension  o accessory muscles with upper trap elevated; grimace on face  Isometric Grip Strength - Appropriate . Pain Score = 5 . Pain Location = arms and shoulders . Percentile Score  Left = 0%   Right = 0% . Grip Avg.  Left = 20 lb   Right = 16 lb . Within Normal Limits .                          One time treatment for Functional capacity evaluation              Problem List Patient Active Problem List   Diagnosis Date Noted  . Rash and nonspecific skin eruption 06/13/2015  .  DDD (degenerative disc disease), cervical 04/23/2015  . S/P lumbar fusion 02/27/2015  . Encounter for chronic pain management 01/02/2015  . Hypercholesteremia 12/04/2014  . Lumbar degenerative disc disease 12/03/2014  . Preventative health care 12/03/2014  . Endometriosis 12/03/2014  . GERD (gastroesophageal reflux disease) 12/03/2014  . Depression 12/03/2014   Rayetta Humphrey, PT CLT [phone] 07/04/2015, 12:20 PM  [facility] [address] [place], [place], [address] Phone: [phone]   Fax:  [phone]

## 2015-07-05 ENCOUNTER — Ambulatory Visit (INDEPENDENT_AMBULATORY_CARE_PROVIDER_SITE_OTHER): Payer: Self-pay | Admitting: Family Medicine

## 2015-07-05 ENCOUNTER — Ambulatory Visit (HOSPITAL_COMMUNITY): Payer: No Typology Code available for payment source | Admitting: Specialist

## 2015-07-05 ENCOUNTER — Encounter: Payer: Self-pay | Admitting: Family Medicine

## 2015-07-05 ENCOUNTER — Telehealth: Payer: Self-pay | Admitting: Family Medicine

## 2015-07-05 VITALS — BP 134/81 | HR 97 | Temp 98.2°F | Ht 62.0 in | Wt 164.2 lb

## 2015-07-05 DIAGNOSIS — R1012 Left upper quadrant pain: Secondary | ICD-10-CM

## 2015-07-05 DIAGNOSIS — M5136 Other intervertebral disc degeneration, lumbar region: Secondary | ICD-10-CM

## 2015-07-05 DIAGNOSIS — R109 Unspecified abdominal pain: Secondary | ICD-10-CM

## 2015-07-05 HISTORY — DX: Left upper quadrant pain: R10.12

## 2015-07-05 LAB — POCT UA - MICROSCOPIC ONLY

## 2015-07-05 LAB — POCT URINALYSIS DIPSTICK
Bilirubin, UA: NEGATIVE
Glucose, UA: NEGATIVE
KETONES UA: NEGATIVE
Nitrite, UA: NEGATIVE
PH UA: 6.5
PROTEIN UA: NEGATIVE
RBC UA: NEGATIVE
SPEC GRAV UA: 1.015
UROBILINOGEN UA: 0.2

## 2015-07-05 MED ORDER — CYCLOBENZAPRINE HCL 10 MG PO TABS: 10.0000 mg | ORAL_TABLET | Freq: Three times a day (TID) | ORAL | Status: DC | PRN

## 2015-07-05 MED ORDER — SENNOSIDES-DOCUSATE SODIUM 8.6-50 MG PO TABS: 2.0000 | ORAL_TABLET | Freq: Every day | ORAL | Status: DC

## 2015-07-05 MED ORDER — CEPHALEXIN 500 MG PO CAPS: 500.0000 mg | ORAL_CAPSULE | Freq: Two times a day (BID) | ORAL | Status: DC

## 2015-07-05 MED ORDER — HYDROCODONE-ACETAMINOPHEN 10-325 MG PO TABS: 1.0000 | ORAL_TABLET | Freq: Four times a day (QID) | ORAL | Status: DC | PRN

## 2015-07-05 NOTE — Assessment & Plan Note (Signed)
Patient has had acute flare of low back pain status post mechanical fall at home. -Prescription for hydrocodone-acetaminophen 10-3 25 every 6 hours given -Step tramadol -Patient to discontinue gabapentin and switch back to Lyrica -Short course of Flexeril given for muscle spasm

## 2015-07-05 NOTE — Patient Instructions (Addendum)
It was a pleasure to see you today. I am sorry that you are in pain.  Back pain - You have been given a refill of your Hydrocodone. Please restart the Lyrica 75 mg BID. Take Flexeril 10 mg three times per day prn pain.   Stomach pain - I think that you symptoms may be related to constipation. Please start Senna S two tablets daily. If you do not have a bowel movement in 2 days please call my office so that I can send you in something stronger.

## 2015-07-05 NOTE — Progress Notes (Addendum)
   Subjective:    Patient ID: Toni Chan, female    DOB: [birth year], 49 y.o.   MRN: [id number]  HPI 49 year old female presents for evaluation of left upper quadrant abdominal pain.  LUQ abdominal pain-patient reports that one week ago she had acute onset of left upper quadrant abdominal pain, sharp pain lasted for a couple of hours, the shortness resolved however she had constant pain throughout the rest of the day, no current pain, she admits to associated nausea which has persisted, no associated emesis, no current abdominal pain, no shortness of breath, no chest pain, no dysuria, however patient did note "stinky yellow urine "the day of her symptoms, the patient does have a history of kidney stones however this pain is different, she reports that her last BM was over a week ago, she has been taking over-the-counter stool softener which is provided minimal relief of her symptoms, she also used an enema which did not produce a BM   Chronic low back pain - patient reports that she stumbled and fell approximately one week ago, this was related to her current symptoms of nausea and dizziness, she notes no syncope or loss of consciousness, she did hit her head without injury, since that time she's had worsening bilateral lumbar back pain with radiation of pain down her right leg, she's been taking tramadol which has provided minimal relief of her symptoms, she is interested in returning to hydrocodone-acetaminophen, she also wishes to return to Lyrica which previously provided good relief of her symptoms  Social-patient recently completed functional capacity evaluation in preparation for disability hearing, nonsmoker   Review of Systems  Constitutional: Negative for fever, chills and fatigue.  Respiratory: Negative for cough, choking and shortness of breath.   Cardiovascular: Negative for chest pain.  Gastrointestinal: Positive for abdominal pain and constipation. Negative for diarrhea.    Musculoskeletal: Positive for back pain and arthralgias.       Objective:   Physical Exam Vitals: Reviewed Gen.: Pleasant female, appears in mild distress secondary to pain HEENT: Normocephalic, pupils equal round and reactive to light, extraocular movements are intact, dry mucous membranes, neck was supple, no anterior posterior cervical lymphadenopathy Cardiac: Regular rate and rhythm, S1 and S2 present, no murmurs Respiratory: Clear to station bilaterally, normal effort Abdomen: Soft, mild left lower quadrant and left upper quadrant tenderness, no suprapubic tenderness, no rebound, no guarding MSK: Bilateral paraspinal and midline lumbar tenderness present on exam Skin: Previous lumbar incision appears well-healed without evidence of acute infection, no rash over abdomen  Point-of-care urine dipstick showed small leukocyte esterase without other acute abnormality, microscopy pending       Assessment & Plan:  Abdominal pain, left upper quadrant Patient presents for evaluation of 1 week history of left upper abdominal pain. Clinically symptoms are consistent with constipation. Urine dipstick did show small amount of leukocyte esterase and microscopy pending. Symptoms not consistent with previous kidney stone. -Follow urine results -Empirically treat for constipation with senna S 2 tablets daily  Lumbar degenerative disc disease Patient has had acute flare of low back pain status post mechanical fall at home. -Prescription for hydrocodone-acetaminophen 10-3 25 every 6 hours given -Step tramadol -Patient to discontinue gabapentin and switch back to Lyrica -Short course of Flexeril given for muscle spasm

## 2015-07-05 NOTE — Telephone Encounter (Signed)
Discussed urine microscopy wit patient. Clinical picture not consistent with UTI. Will send in prescription for Keflex to use if symptoms not improved in 48 hours.

## 2015-07-05 NOTE — Assessment & Plan Note (Signed)
Patient presents for evaluation of 1 week history of left upper abdominal pain. Clinically symptoms are consistent with constipation. Urine dipstick did show small amount of leukocyte esterase and microscopy pending. Symptoms not consistent with previous kidney stone. -Follow urine results -Empirically treat for constipation with senna S 2 tablets daily

## 2015-07-09 ENCOUNTER — Ambulatory Visit (HOSPITAL_COMMUNITY): Payer: No Typology Code available for payment source

## 2015-07-11 ENCOUNTER — Ambulatory Visit (HOSPITAL_COMMUNITY): Payer: No Typology Code available for payment source

## 2015-07-17 ENCOUNTER — Encounter (HOSPITAL_COMMUNITY): Payer: Self-pay | Admitting: Occupational Therapy

## 2015-07-17 ENCOUNTER — Other Ambulatory Visit: Payer: Self-pay | Admitting: Family Medicine

## 2015-07-17 ENCOUNTER — Ambulatory Visit (HOSPITAL_COMMUNITY): Payer: No Typology Code available for payment source | Admitting: Occupational Therapy

## 2015-07-17 NOTE — Therapy (Signed)
[facility] [address] [place], [place], [address] Phone: [phone]   Fax:  [phone]  July 17, 2015    @CCLISTADDRESS @  Occupational Therapy Discharge Summary   Patient: Toni Chan MRN: [id number] Date of Birth: Jun 23, 1966  Diagnosis: No diagnosis found.    Visits from Start of Care: 1  Current functional level related to goals / functional outcomes: No goals met. Pt has not returned for occupational therapy treatment visits since evaluation on 06/25/15.    OT Short Term Goals - 06/25/15 1431    OT SHORT TERM GOAL #1   Title Patient will be educated on an HEP.   Time 4   Period Weeks   Status New   OT SHORT TERM GOAL #2   Title Patient will improve bilateral shoulder flexion and abduction to 60% range for increased ability to reach overhead when putting clothing away.   Time 4   Period Weeks   Status New   OT SHORT TERM GOAL #3   Title Patient will improve right arm strength to 4/5 for increased ability to participate in daily tasks.   Time 4   Period Weeks   Status New   OT SHORT TERM GOAL #4   Title Patient will improve right hand grip strength by 3 pounds or more for increased ability to open containers when cooking.    Time 4   Period Weeks   Status New   OT SHORT TERM GOAL #5   Title Patient will be educated on energy conservation techniques.   Time 4   Period Weeks   Status New   Additional Short Term Goals   Additional Short Term Goals Yes   OT SHORT TERM GOAL #6   Title Patient will be educated on strategies to improve ease with bowel movements.   Time 4   Period Weeks   Status New           OT Long Term Goals - 06/25/15 1435    OT LONG TERM GOAL #1   Title Patient will complete B/IADLs at highest level of independence possible.   Time 8   Period Weeks   Status New   OT LONG  TERM GOAL #2   Title Patient will improve bilateral shoulder flexion and abduction to 75% range for increased ability to reach overhead when putting clothing away.   Time 8   Period Weeks   Status New   OT LONG TERM GOAL #3   Title Patient will improve right arm strength to 4+/5 for increased ability to participate in daily tasks.   Time 8   Period Weeks   Status New   OT LONG TERM GOAL #4   Title Patient will improve right hand grip strength by 8 pounds or more for increased ability to open containers when cooking.    Time 8   Period Weeks   Status New   OT LONG TERM GOAL #5   Title Patient will independently apply energy conservation techniques to her daily tasks.   Time 6   Period Weeks        Plan: Patient agrees to discharge.  Patient goals were not met. Patient is being discharged due to not returning since the last visit.  ?????         Sincerely,  Guadelupe Sabin, OTR/L  [phone] 07/17/2015   [facility] [address] [place], [place], [address] Phone: [phone]   Fax:  [phone]  Patient: [name] MRN: [id number] Date of Birth: 17-Dec-1965

## 2015-07-19 ENCOUNTER — Encounter: Payer: Self-pay | Admitting: Family Medicine

## 2015-07-19 ENCOUNTER — Ambulatory Visit (INDEPENDENT_AMBULATORY_CARE_PROVIDER_SITE_OTHER): Payer: No Typology Code available for payment source | Admitting: Family Medicine

## 2015-07-19 ENCOUNTER — Encounter (HOSPITAL_COMMUNITY): Payer: No Typology Code available for payment source

## 2015-07-19 VITALS — BP 113/76 | HR 88 | Temp 98.2°F | Ht 62.0 in | Wt 163.0 lb

## 2015-07-19 DIAGNOSIS — M5030 Other cervical disc degeneration, unspecified cervical region: Secondary | ICD-10-CM

## 2015-07-19 DIAGNOSIS — K5900 Constipation, unspecified: Secondary | ICD-10-CM

## 2015-07-19 DIAGNOSIS — G8929 Other chronic pain: Secondary | ICD-10-CM

## 2015-07-19 DIAGNOSIS — F4010 Social phobia, unspecified: Secondary | ICD-10-CM | POA: Insufficient documentation

## 2015-07-19 DIAGNOSIS — Z7189 Other specified counseling: Secondary | ICD-10-CM

## 2015-07-19 MED ORDER — CYCLOBENZAPRINE HCL 10 MG PO TABS: 10.0000 mg | ORAL_TABLET | Freq: Three times a day (TID) | ORAL | Status: DC | PRN

## 2015-07-19 MED ORDER — PROPRANOLOL HCL 60 MG PO TABS: 60.0000 mg | ORAL_TABLET | Freq: Once | ORAL | Status: DC

## 2015-07-19 NOTE — Assessment & Plan Note (Signed)
Pain improved from previous visit. Currently 7/10. -Continue current regimen of Flexeril, Lyrica, Cymbalta, and Norco.

## 2015-07-19 NOTE — Addendum Note (Signed)
Addended by: Lupita Dawn on: 07/19/2015 01:10 PM   Modules accepted: Level of Service

## 2015-07-19 NOTE — Patient Instructions (Addendum)
It was nice to see you today.  Group anxiety - please try Propanolol the day before your hearing, if you do not have side effects than use it prior to your hearing  Back pain - Dr. Ree Kida will send in the refill of the Lyrica  Constipation - begin to take Senna-S daily (one tablet and increase to 2 tablets daily if no BM).  Return in one month.

## 2015-07-19 NOTE — Progress Notes (Addendum)
   Subjective:    Patient ID: Toni Chan, female    DOB: 09-19-1966, 50 y.o.   MRN: [id number]  HPI 49 year old female presents for follow-up of the lumbar back pain.  Lumbar degenerative disc disease status post multiple back surgeries - patient reports pain is currently 7/10, improved from previous visit as she has restarted Lyrica daily, using Flexeril 2-3 times per day, using Norco usually only once daily, no new weakness or numbness in her lower extremities, no new bladder or bowel incontinence, no fevers  Constipation - patient continues to have intermittent constipation, started on senna S at last visit which he takes intermittently and has produced a bowel movement, no belly pain, tolerating diet  Social - has upcoming court appointment for disability approval, patient reports anxiety related to group situations, she asked if she can get something to help her with anxiety    Review of Systems  Constitutional: Negative for fever, chills and fatigue.  Respiratory: Negative for cough and shortness of breath.   Cardiovascular: Negative for chest pain.  Gastrointestinal: Positive for constipation. Negative for nausea, abdominal pain, diarrhea and rectal pain.       Objective:   Physical Exam Vitals: Reviewed Gen.: Pleasant female, no acute distress Cardiac: Regular rate and rhythm, S1 and S2 present, no murmurs, no heaves or thrills Respiratory: Clear to auscultation bilaterally, normal effort MSK: Back is tender to palpation, no erythema or swelling noted Skin: Multiple incision sites of the back are well-healed     Assessment & Plan:  DDD (degenerative disc disease), cervical Pain improved from previous visit. Currently 7/10. -Continue current regimen of Flexeril, Lyrica, Cymbalta, and Norco.  Encounter for chronic pain management Pain improved from previous visit. Currently 7/10. -Continue current regimen of Flexeril, Lyrica, Cymbalta, and  Norco.  Constipation Improved with when necessary Senokot S however still remains constipated most days. -Start senna S 1 tablet daily -Encouraged fiber in diet  Social anxiety disorder Patient reports anxiety when in large groups. As she has upcoming court appointment for disability hearing she requests something to help with anxiety. -gave script for propranolol 60 mg to take prior to court appointment (gave one extra to try the day before so evaluate for side effects)

## 2015-07-19 NOTE — Assessment & Plan Note (Signed)
Improved with when necessary Senokot S however still remains constipated most days. -Start senna S 1 tablet daily -Encouraged fiber in diet

## 2015-07-19 NOTE — Assessment & Plan Note (Signed)
Patient reports anxiety when in large groups. As she has upcoming court appointment for disability hearing she requests something to help with anxiety. -gave script for propranolol 60 mg to take prior to court appointment (gave one extra to try the day before so evaluate for side effects)

## 2015-07-22 ENCOUNTER — Telehealth: Payer: Self-pay | Admitting: Family Medicine

## 2015-07-22 NOTE — Telephone Encounter (Signed)
Pt called and would like [place] to fax her medication list to include when she started her medications and also her OT results to her lawyer. Please put this attention Diane at [phone]. Blima Rich

## 2015-07-22 NOTE — Telephone Encounter (Signed)
Patient has signed ROI in chart, information faxed to number provided.

## 2015-07-23 ENCOUNTER — Encounter (HOSPITAL_COMMUNITY): Payer: No Typology Code available for payment source

## 2015-07-25 ENCOUNTER — Encounter (HOSPITAL_COMMUNITY): Payer: No Typology Code available for payment source

## 2015-07-29 ENCOUNTER — Telehealth: Payer: Self-pay | Admitting: Family Medicine

## 2015-07-29 MED ORDER — POLYETHYLENE GLYCOL 3350 17 GM/SCOOP PO POWD: 17.0000 g | Freq: Every day | ORAL | Status: DC

## 2015-07-29 NOTE — Telephone Encounter (Signed)
Patient reports pain in right lower spine, radiates down back of leg, no bladder/bowel incontinence, having BM every few days with Senna S; taking Norco twice daily, continues to take Flexeril/Cymbalta/Lyrica  Start Miralax and fiber daily.  Counseled patient to see Dr. Lorin Mercy if pain not improved in 2-3 days.

## 2015-07-29 NOTE — Telephone Encounter (Signed)
Pt is asking for provider to call her at the earliest convenience. She states that the pain in her back is worsening and she states that she has "tried everything from hot and cold compresses to stretching" and nothing is helping. She reports that she has not taken any additional pain medication and would like advice or suggestions as to what else she can do to help. Thank you, Fonda Kinder, ASA

## 2015-08-07 ENCOUNTER — Other Ambulatory Visit: Payer: Self-pay | Admitting: Family Medicine

## 2015-08-07 ENCOUNTER — Encounter: Payer: Self-pay | Admitting: Family Medicine

## 2015-08-07 NOTE — Telephone Encounter (Signed)
RN staff - please schedule for tomorrow at 11:30. Inform her that all narcotic refills require in person visit. Thanks

## 2015-08-07 NOTE — Telephone Encounter (Signed)
Need refill on hydrocodone. Has enough to get thru weekend

## 2015-08-07 NOTE — Progress Notes (Signed)
[place] to update shipping address to [place].  Re-faxed prescription for Lyrica to [employer].

## 2015-08-07 NOTE — Telephone Encounter (Signed)
Patient informed, appointment scheduled for tomm at 11:30

## 2015-08-08 ENCOUNTER — Ambulatory Visit (INDEPENDENT_AMBULATORY_CARE_PROVIDER_SITE_OTHER): Payer: No Typology Code available for payment source | Admitting: Family Medicine

## 2015-08-08 ENCOUNTER — Telehealth: Payer: Self-pay | Admitting: Psychology

## 2015-08-08 VITALS — BP 129/82 | HR 82 | Temp 98.1°F | Wt 172.0 lb

## 2015-08-08 DIAGNOSIS — F32A Depression, unspecified: Secondary | ICD-10-CM

## 2015-08-08 DIAGNOSIS — Z7189 Other specified counseling: Secondary | ICD-10-CM

## 2015-08-08 DIAGNOSIS — M5136 Other intervertebral disc degeneration, lumbar region: Secondary | ICD-10-CM

## 2015-08-08 DIAGNOSIS — J302 Other seasonal allergic rhinitis: Secondary | ICD-10-CM

## 2015-08-08 DIAGNOSIS — F329 Major depressive disorder, single episode, unspecified: Secondary | ICD-10-CM

## 2015-08-08 DIAGNOSIS — Z23 Encounter for immunization: Secondary | ICD-10-CM

## 2015-08-08 DIAGNOSIS — G8929 Other chronic pain: Secondary | ICD-10-CM

## 2015-08-08 DIAGNOSIS — J309 Allergic rhinitis, unspecified: Secondary | ICD-10-CM | POA: Insufficient documentation

## 2015-08-08 MED ORDER — HYDROCODONE-ACETAMINOPHEN 10-325 MG PO TABS: 1.0000 | ORAL_TABLET | Freq: Four times a day (QID) | ORAL | Status: DC | PRN

## 2015-08-08 NOTE — Assessment & Plan Note (Signed)
Pain is stable -refill of Norco provided

## 2015-08-08 NOTE — Assessment & Plan Note (Signed)
Pain stable, continues to have intermittent swelling likely related to potential space from multiple surgeries. -follow up planned with Dr. Lorin Mercy later this month -refill of Norco provided

## 2015-08-08 NOTE — Patient Instructions (Signed)
It was nice to see you today.  Back pain - please follow up with Dr. Lorin Mercy, refill of Norco provided, continue Lyrica and Flexeril  Anxiety - please call Dr. Gwenlyn Saran and set up an appointment  Allergies/congestion - take over the counter allergy/cold medications

## 2015-08-08 NOTE — Assessment & Plan Note (Signed)
Flare of seasonal allergies.  -attempt trial of otc cough/cold medications -if persists start antihistimine

## 2015-08-08 NOTE — Telephone Encounter (Signed)
Patient called per the recommendation of Dr. Ree Kida.  Described Integrated Care vs more traditional therapy.  She does not have insurance and elected to come here.  Scheduled her with Candelaria Celeste for 11/15 at 10:00.  Asked her to identify some specific goals for therapy given the focused nature of Integrated Care.  She voiced an understanding.  Will let PCP and Alex know.
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
## 2015-08-13 ENCOUNTER — Ambulatory Visit (INDEPENDENT_AMBULATORY_CARE_PROVIDER_SITE_OTHER): Payer: Self-pay | Admitting: Psychology

## 2015-08-13 DIAGNOSIS — F329 Major depressive disorder, single episode, unspecified: Secondary | ICD-10-CM

## 2015-08-13 DIAGNOSIS — F418 Other specified anxiety disorders: Secondary | ICD-10-CM

## 2015-08-13 DIAGNOSIS — F419 Anxiety disorder, unspecified: Principal | ICD-10-CM

## 2015-08-13 DIAGNOSIS — F32A Depression, unspecified: Secondary | ICD-10-CM
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
## 2015-08-16 ENCOUNTER — Ambulatory Visit: Payer: No Typology Code available for payment source | Admitting: Family Medicine

## 2015-08-19 ENCOUNTER — Ambulatory Visit: Payer: No Typology Code available for payment source | Admitting: Psychology

## 2015-08-20 ENCOUNTER — Telehealth: Payer: Self-pay | Admitting: Psychology

## 2015-08-20 NOTE — Telephone Encounter (Signed)
Patient left a VM with me on Monday at 8:50 stating she would not be able to make her 10:00 appointment which was scheduled with Candelaria Celeste.  She wishes to reschedule.    I rescheduled her for 11/29 at 10:00 a.m.  Will let Sadie and Alex know.

## 2015-08-27 ENCOUNTER — Ambulatory Visit (INDEPENDENT_AMBULATORY_CARE_PROVIDER_SITE_OTHER): Payer: No Typology Code available for payment source | Admitting: Psychology

## 2015-08-27 DIAGNOSIS — F32A Depression, unspecified: Secondary | ICD-10-CM

## 2015-08-27 DIAGNOSIS — F329 Major depressive disorder, single episode, unspecified: Secondary | ICD-10-CM
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
## 2015-08-27 NOTE — Assessment & Plan Note (Signed)
Assessment / Plan / Recommendations:  [name] appears to be functioning well despite her chronic pain, as well as depression and anxiety related to current life stressors. Moreover, she has a relatively positive outlook on her situation and is motivated to make progress toward improving her mood and living conditions. [name] would like to continue IC appointments with [facility] and is scheduled to return on 12/6 to begin CBT-related work focused on her mood and activities.

## 2015-09-03 ENCOUNTER — Ambulatory Visit: Payer: No Typology Code available for payment source

## 2015-09-04 ENCOUNTER — Telehealth: Payer: Self-pay | Admitting: Family Medicine

## 2015-09-04 NOTE — Telephone Encounter (Signed)
Contacted pt to see if she wanted to reschedule her 09/03/15  appt that she canceled. LVM to return call and reschedule with Cristie Hem. FYI. Sadie Reynolds, ASA

## 2015-09-06 ENCOUNTER — Ambulatory Visit (INDEPENDENT_AMBULATORY_CARE_PROVIDER_SITE_OTHER): Payer: No Typology Code available for payment source | Admitting: Family Medicine

## 2015-09-06 ENCOUNTER — Encounter: Payer: Self-pay | Admitting: Family Medicine

## 2015-09-06 VITALS — BP 122/85 | HR 87 | Temp 98.3°F | Ht 62.0 in | Wt 180.7 lb

## 2015-09-06 DIAGNOSIS — Z7189 Other specified counseling: Secondary | ICD-10-CM

## 2015-09-06 DIAGNOSIS — K5900 Constipation, unspecified: Secondary | ICD-10-CM

## 2015-09-06 DIAGNOSIS — M5136 Other intervertebral disc degeneration, lumbar region: Secondary | ICD-10-CM

## 2015-09-06 DIAGNOSIS — M25511 Pain in right shoulder: Secondary | ICD-10-CM

## 2015-09-06 DIAGNOSIS — G8929 Other chronic pain: Secondary | ICD-10-CM

## 2015-09-06 DIAGNOSIS — R21 Rash and other nonspecific skin eruption: Secondary | ICD-10-CM

## 2015-09-06 LAB — POCT SKIN KOH: SKIN KOH, POC: NEGATIVE

## 2015-09-06 MED ORDER — CYCLOBENZAPRINE HCL 10 MG PO TABS: 10.0000 mg | ORAL_TABLET | Freq: Three times a day (TID) | ORAL | Status: DC | PRN

## 2015-09-06 MED ORDER — GLYCERIN (LAXATIVE) 2 G RE SUPP: 1.0000 | Freq: Once | RECTAL | Status: DC

## 2015-09-06 MED ORDER — HYDROCODONE-ACETAMINOPHEN 10-325 MG PO TABS: 1.0000 | ORAL_TABLET | Freq: Four times a day (QID) | ORAL | Status: DC | PRN

## 2015-09-06 MED ORDER — ACYCLOVIR 800 MG PO TABS: 800.0000 mg | ORAL_TABLET | Freq: Every day | ORAL | Status: DC

## 2015-09-06 NOTE — Patient Instructions (Addendum)
It was very nice to meet you today! I will see you for a follow up on Friday, 12/16 at 11:00 AM.  Today we talked about how stress and anxiety can cause tension that might be painful. These exercises can reduce tension:  Diaphragmatic Breathing ("belly breathing") - Find a relaxing place (like a bed or couch) - it is best to lay down. - One hand on stomach, one on chest - stomach should rise and fall, chest should stay still - Breathe in 8 seconds, out 5 seconds - Practice 5 minutes daily  Progressive Muscle Relaxation  - Sit down in relaxing place (e.g., on sofa) - Tense various muscle groups (arms, legs, shoulders, neck, stomach, facial muscles) - Tense for 10 seconds; relax for 10 seconds - notice the difference in the tense and relaxed sensations. - Practice 5 minutes/day  Best, Elton Sin   It was nice to see you today.  Rash - Dr. Ree Kida will call you with you skin scraping results. Continue steroid cream to the affected area.  Shoulder/neck pain - please continue current therapy, attempt relaxation techniques as discussed today with Elton Sin.  If pain persists call Dr. Ree Kida and we will send you to physical therapy.   Dr. Ree Kida

## 2015-09-06 NOTE — Assessment & Plan Note (Signed)
Back pain stable -refill of Norco provided

## 2015-09-06 NOTE — Addendum Note (Signed)
Addended by: Lupita Dawn on: 09/06/2015 03:23 PM   Modules accepted: Orders

## 2015-09-06 NOTE — Assessment & Plan Note (Signed)
Intermittent constipation/loss of feeling of urge -continue Miralax and Lorenda Cahill S to soften stools -attempt trial of suppository when feeling increased pressure in low back from need to have BM

## 2015-09-06 NOTE — Assessment & Plan Note (Addendum)
Rash of thoracic back. DDX fungal infection vs allergic dermatitis vs shingles. No change with topical steroid.   -KOH skin scraping negative.  -attempt trial of acyclovir.

## 2015-09-06 NOTE — Assessment & Plan Note (Signed)
Pain is stable on current regimen of Norco/Lyrica/Cymbalta. -refill of Norco provided

## 2015-09-06 NOTE — Progress Notes (Addendum)
   Subjective:    Patient ID: Toni Chan, female    DOB: 02-15-1966, 49 y.o.   MRN: [id number]  HPI 49 y/o female presents for follow up of chronic pain and evaluation of rash.  Rash - thoracic right back, present for the past few days, itches, previous rash on left back a few years ago that resolved spontaneously, has been applying hydrocortisone cream with minimal relief. No vesicles, no drainage. Some burning.   Chronic low back pain s/p multiple surgeries - stable on Norco, Lyrica, Cymbalta.  Right shoulder pain - 2 week history of right posterior shoulder/neck pain, no injury, sometimes radiates down right arm, worse with overhead movement, has been applying heat with some improvement  Constipation - abnormal bowels, no Miralax and Senna S, no longer has sensation to void after multiple surgeries, has BM every few days, had large very painful BM yesterday, has attempted enemas however unable to hold fluid in bowels, stools are soft, no blood  Social - awaiting decision on Disbility   Review of Systems  Constitutional: Negative for fever, chills and fatigue.  Respiratory: Negative for shortness of breath.   Cardiovascular: Negative for chest pain.  Gastrointestinal: Positive for constipation. Negative for nausea, vomiting and diarrhea.       Objective:   Physical Exam Vitals: reviewed Gen: pleasant female, appears in pain Skin: erythemetous plague on right thoracic back with scaling (skin scraping obtained) MSK: diffuse tenderness of cervical neck/right trapezius/right deltoid; decreased Abduction and Forward flexion due to pain, rotator cuff testing (empty can, ER, lift off) all normal, negative Hawkings, posterior shoulder pain with Neers     Assessment & Plan:  Lumbar degenerative disc disease Pain is stable on current regimen of Norco/Lyrica/Cymbalta. -refill of Norco provided  Encounter for chronic pain management Back pain stable -refill of Norco  provided  Constipation Intermittent constipation/loss of feeling of urge -continue Miralax and Lorenda Cahill S to soften stools -attempt trial of suppository when feeling increased pressure in low back from need to have BM  Rash and nonspecific skin eruption Rash of thoracic back. DDX fungal infection vs allergic dermatitis vs shingles. No change with topical steroid.   -KOH skin scraping negative.  -attempt trial of acyclovir.   Right shoulder pain Right shoulder/trapezius/cervical neck pain. Rotator cuff testing intact. Has know Cervical DDD. Stress/muscle tightness likely contributing. -continue Flexeril/Norco/Lyrica/Cymbalta -heat as tolerated -patient discussed progressive muscle relaxation with Elton Sin.  -if persists may try PT

## 2015-09-06 NOTE — Assessment & Plan Note (Signed)
Right shoulder/trapezius/cervical neck pain. Rotator cuff testing intact. Has know Cervical DDD. Stress/muscle tightness likely contributing. -continue Flexeril/Norco/Lyrica/Cymbalta -heat as tolerated -patient discussed progressive muscle relaxation with Elton Sin.  -if persists may try PT

## 2015-09-06 NOTE — Progress Notes (Signed)
Dr. Ree Kida requested a [place].   Presenting Issue: [name] presents with chronic back pain, as well as acute pain and tension in her shoulders, neck,and arms.    Report of symptoms: Patient's chief complaint was difficulty managing chronic pain. She also reports tension and anxiety related to her financial state and difficulties with her brother.  Duration of CURRENT symptoms: Patient has experienced chronic back pain since 2012 (per patient report)  Impact on function: Patient is unable to work or engage in many physical activities due to chronic back pain.   Psychiatric History - Diagnoses: Social Anxiety Disorder, Depression (per chart) - Pharmacotherapy: Cymbalta, Lyrica, Tramadol (per chart) - Outpatient therapy: Currently receiving BH services from Toni Chan, member of [facility] team at [place]. Would like to attend appointments on Friday from now on due to other time commitments on Tuesdays.  Current and history of substance use: Patient denies alcohol or drug use; reported that her parents had alcohol problems and that her brother typically starts drinking around noon and becomes intoxicated by 1 PM; these factors have made her determined to avoid alcohol and drugs.  Medical conditions that might explain or contribute to symptoms: Lumbar degenerative disk disease  Assessment / Plan / Recommendations:Patient reported that she was in relatively severe pain and stopped speaking mid-sentence to grimace several times throughout the appointment. She reported that she has had back pain since 2012, as well as pain in other areas of her body, such as arms, shoulders, neck, and stomach. She believes that her pain is exacerbated by tension and anxiety. [facility] used motivational interviewing strategies to reinforce idea that stress-reduction techniques can help ameliorate pain by reducing tension. [facility] taught patient diaphragmatic breathing and progressive muscle  relaxation; patient expressed enthusiasm to do these techniques at home. Patient would also like additional social support, and attended church last week to improve her level of activity and to meet new people. She currently spends most of her time taking care of her brother, but that she views it as important because he supports her financially. However, she reports that her brother is currently the only person she regularly talks to, and that she is unable to talk to him after 12 PM because he begins drinking at that time. She enjoyed some aspects of attending church, but reported that sitting and standing exacerbated her back pain.

## 2015-09-09 ENCOUNTER — Telehealth: Payer: Self-pay | Admitting: Family Medicine

## 2015-09-09 NOTE — Telephone Encounter (Signed)
Contacted patient and LVM to let her know that I was confirming appt with Brad on Friday. Sadie Reynolds, ASA

## 2015-09-11 ENCOUNTER — Telehealth: Payer: Self-pay | Admitting: Family Medicine

## 2015-09-11 NOTE — Telephone Encounter (Signed)
Called and LVM to reschedule pt appt that was canceled for 09/13/15. Sadie Reynolds, ASA

## 2015-09-13 ENCOUNTER — Ambulatory Visit: Payer: No Typology Code available for payment source

## 2015-09-18 ENCOUNTER — Telehealth: Payer: Self-pay | Admitting: Family Medicine

## 2015-09-18 NOTE — Telephone Encounter (Signed)
Did schedule appt with dr Ree Kida jan 6 at 11 am. Would like to have an appt with brad for after that on the same day

## 2015-09-18 NOTE — Telephone Encounter (Signed)
Scheduled her for 11:30 that day or whenever she finished the appointment with Dr. Ree Kida.  If Dr. Ree Kida is running at all behind, perhaps she can be seen before her 11:00.  Will forward to [place].

## 2015-09-24 NOTE — Telephone Encounter (Signed)
Called and LVM to inform pt of below. Sadie Reynolds, ASA

## 2015-10-04 ENCOUNTER — Ambulatory Visit (INDEPENDENT_AMBULATORY_CARE_PROVIDER_SITE_OTHER): Payer: No Typology Code available for payment source | Admitting: Psychology

## 2015-10-04 ENCOUNTER — Ambulatory Visit (INDEPENDENT_AMBULATORY_CARE_PROVIDER_SITE_OTHER): Payer: No Typology Code available for payment source | Admitting: Family Medicine

## 2015-10-04 ENCOUNTER — Telehealth: Payer: Self-pay | Admitting: Family Medicine

## 2015-10-04 ENCOUNTER — Encounter: Payer: Self-pay | Admitting: Family Medicine

## 2015-10-04 VITALS — BP 161/92 | HR 109 | Temp 98.1°F | Ht 62.0 in | Wt 176.0 lb

## 2015-10-04 DIAGNOSIS — F32A Depression, unspecified: Secondary | ICD-10-CM

## 2015-10-04 DIAGNOSIS — F419 Anxiety disorder, unspecified: Principal | ICD-10-CM

## 2015-10-04 DIAGNOSIS — M5136 Other intervertebral disc degeneration, lumbar region: Secondary | ICD-10-CM

## 2015-10-04 DIAGNOSIS — F329 Major depressive disorder, single episode, unspecified: Secondary | ICD-10-CM

## 2015-10-04 DIAGNOSIS — F418 Other specified anxiety disorders: Secondary | ICD-10-CM

## 2015-10-04 MED ORDER — HYDROCODONE-ACETAMINOPHEN 10-325 MG PO TABS: 1.0000 | ORAL_TABLET | Freq: Four times a day (QID) | ORAL | Status: DC | PRN

## 2015-10-04 MED ORDER — BUSPIRONE HCL 7.5 MG PO TABS: 7.5000 mg | ORAL_TABLET | Freq: Two times a day (BID) | ORAL | Status: DC

## 2015-10-04 NOTE — Assessment & Plan Note (Addendum)
Patient presented to appointment as very sad and tearful. Patient is currently living in her brother's house (this is not her brother's primary residence, so she does not live with him), and reported that her brother "sent his wife" to inform her that they were selling the house. As a result, patient does not know where she will live, and is very distressed. She has not yet been asked to leave the house, but is currently staying with her aunt and uncle because she becomes emotionally distraught every time she sees the "for sale" sign in the front yard. However, she reports that they are elderly and that she feels like a burden to them, and states that she refuses to stay with them once the house is sold because "it would be unfair". She denied any other sources of support, and is not aware of alternative housing options at this time. She also reported that she is overwhelmed because her son (who was present with her at the appointment) is homeless. Patient does not currently have any sources of income, and stated that she has "no money at all", and could not afford any form of housing where she is required to pay. She is unemployed, and had a disability hearing in October that she feels was promising; she was told that she would receive a decision regarding her disability benefits "1 to 4" months after the hearing. [facility] expressed empathy and listened to patient talk, and then consulted with Hunt Oris (LCSW). LCSW provided patient with materials for shelters, free meals, and other area resources in the event that patient is forced to leave her house before she begins receiving disability payments. Patient stated that she felt much better after talking and receiving this information, and expressed gratitude for appointments with LCSW, Dr. Ree Kida, and [facility]. She will follow-up with [facility] in one week.

## 2015-10-04 NOTE — Progress Notes (Signed)
Reason for follow-up: Patient followed-up with [facility] to continue treating symptoms of depression and anxiety.  Issues discussed: Current stressors (financial state; housing options)  Identified goals: Patient would like to improve symptoms of anxiety and depression.

## 2015-10-04 NOTE — Assessment & Plan Note (Signed)
Acute exacerbation of anxiety and depression. -Information on housing provided by social work -Counseling and support provided by social work, Chemical engineer, and provider -Continue Cymbalta 60 mg daily -Initiate trial of BuSpar 7.5 mg twice a day

## 2015-10-04 NOTE — Progress Notes (Signed)
   Subjective:    Patient ID: Toni Chan, female    DOB: [birth year], 50 y.o.   MRN: [id number]  HPI 50 year old Caucasian female presents for follow-up of anxiety, depression, and chronic back pain.  Anxiety/depression - patient reports 48 acute exacerbation of depression, she was notified by her brother's wife that they would be taking her out of her household, she currently has no income and is unable to afford housing on her own, she reports increased depression and anxiety symptoms related to this episode, she does report initial suicidal thoughts which have resolved, she states her children as a reason to live, she is going to be seeking housing with her aunt and uncle, the patient was also seen by our Education officer, museum and psychology student (please see attached notes).   Chronic back pain - patient reports continued low back pain with radiation down her right leg, no bladder or bowel incontinence, takes Norco 10-325 mg 2-3 times per day, taking Lyrica 150 mg twice daily, uses Flexeril infrequently  Social-nonsmoker, currently living in her brothers household  Review of Systems  Constitutional: Negative for chills and fatigue.  Respiratory: Negative for cough and shortness of breath.   Gastrointestinal: Positive for constipation. Negative for nausea and vomiting.       Objective:   Physical Exam Vitals: Reviewed (low pressure elevated likely secondary to anxiety) Gen.: Pleasant female, crying, in distress, accompanied by her son Psych: Well dressed, affect is outgoing however patient is tearful, mood is depressed, no tangential thoughts, no suicidal thoughts, no hallucinations, does not appear to be responding to internal stimuli     Assessment & Plan:  Lumbar degenerative disc disease Pain is uncontrolled at this time. -Continue Norco 10-325 mg 3 times a day when necessary (per scription provided) -Continue Cymbalta 60 mg daily -Increase Lyrica to 150 mg 3 times a day  Anxiety  and depression Acute exacerbation of anxiety and depression. -Information on housing provided by social work -Counseling and support provided by social work, Chemical engineer, and provider -Continue Cymbalta 60 mg daily -Initiate trial of BuSpar 7.5 mg twice a day

## 2015-10-04 NOTE — Patient Instructions (Signed)
It was nice to see you today.  I am sorry to hear about your living situation. My social worker will give you some resources on housing options.  Anxiety/Depression - continue Cymbalta 60 mg daily, start Buspar 7.5 mg twice daily (take no matter what).  Back pain - start to take Lyrica 2 tablets 3 times per day, continue Norco as needed pain.   Return in 1-2 weeks.

## 2015-10-04 NOTE — Telephone Encounter (Signed)
**  Liliana Underberg CALLED AND IS A LITTLE LATE BUT WILL BE HERE; PULLING IN NOW**  Sadie Reynolds, ASA

## 2015-10-04 NOTE — Assessment & Plan Note (Signed)
Pain is uncontrolled at this time. -Continue Norco 10-325 mg 3 times a day when necessary (per scription provided) -Continue Cymbalta 60 mg daily -Increase Lyrica to 150 mg 3 times a day

## 2015-10-07 ENCOUNTER — Telehealth: Payer: Self-pay | Admitting: Family Medicine

## 2015-10-07 NOTE — Telephone Encounter (Signed)
Emergency Line  Contacted Mrs. Toni Chan concerning page to Emergency Line. States she was seen by Dr. Ree Kida and [facility] on 1/6 for severe anxiety. Was started on Buspirone. States her anxiety is centered around dealing with her brother, who is trying to kick her out of her home. States she checked her mail today and found a letter from an attorney stating she owed $3000 within three days for backpay on rent. States she never had any agreement to pay her brother rent for living there. States she has no money and is waiting to be placed on disability. States she has had increased anxiety this morning after receiving this letter. Was initially considering suicide by slitting her wrists, however she states she no longer feels this way and is not currently suicidal. Denies homicidal ideation. Agrees to go to hospital for further evaluation and possible psychiatric admission due to recent suicidal ideation ASAP. Recommended attempting to get to [facility], however if this is too far recommend [place] or [facility] it is essential for her to be seen right away.  Once again questioned about suicidal ideation and denies it at this time.  Dr. Gerlean Ren 10/06/14; 6:19 PM

## 2015-10-08 ENCOUNTER — Emergency Department (HOSPITAL_COMMUNITY)
Admission: EM | Admit: 2015-10-08 | Discharge: 2015-10-09 | Disposition: A | Discharge status: Home / Self Care | Payer: Medicaid Other | Attending: Emergency Medicine | Admitting: Emergency Medicine

## 2015-10-08 ENCOUNTER — Telehealth: Payer: Self-pay | Admitting: Family Medicine

## 2015-10-08 ENCOUNTER — Encounter (HOSPITAL_COMMUNITY): Payer: Self-pay | Admitting: Emergency Medicine

## 2015-10-08 DIAGNOSIS — Z008 Encounter for other general examination: Secondary | ICD-10-CM | POA: Diagnosis present

## 2015-10-08 DIAGNOSIS — K219 Gastro-esophageal reflux disease without esophagitis: Secondary | ICD-10-CM | POA: Diagnosis not present

## 2015-10-08 DIAGNOSIS — F419 Anxiety disorder, unspecified: Secondary | ICD-10-CM | POA: Diagnosis not present

## 2015-10-08 DIAGNOSIS — M545 Low back pain: Secondary | ICD-10-CM | POA: Diagnosis not present

## 2015-10-08 DIAGNOSIS — F29 Unspecified psychosis not due to a substance or known physiological condition: Secondary | ICD-10-CM | POA: Diagnosis not present

## 2015-10-08 DIAGNOSIS — Z7952 Long term (current) use of systemic steroids: Secondary | ICD-10-CM | POA: Diagnosis not present

## 2015-10-08 DIAGNOSIS — Z87442 Personal history of urinary calculi: Secondary | ICD-10-CM | POA: Diagnosis not present

## 2015-10-08 DIAGNOSIS — F17210 Nicotine dependence, cigarettes, uncomplicated: Secondary | ICD-10-CM | POA: Insufficient documentation

## 2015-10-08 DIAGNOSIS — Z872 Personal history of diseases of the skin and subcutaneous tissue: Secondary | ICD-10-CM | POA: Insufficient documentation

## 2015-10-08 DIAGNOSIS — F329 Major depressive disorder, single episode, unspecified: Secondary | ICD-10-CM | POA: Diagnosis not present

## 2015-10-08 DIAGNOSIS — Z8679 Personal history of other diseases of the circulatory system: Secondary | ICD-10-CM | POA: Diagnosis not present

## 2015-10-08 DIAGNOSIS — K589 Irritable bowel syndrome without diarrhea: Secondary | ICD-10-CM | POA: Insufficient documentation

## 2015-10-08 DIAGNOSIS — Q6100 Congenital renal cyst, unspecified: Secondary | ICD-10-CM | POA: Insufficient documentation

## 2015-10-08 DIAGNOSIS — Z8614 Personal history of Methicillin resistant Staphylococcus aureus infection: Secondary | ICD-10-CM | POA: Diagnosis not present

## 2015-10-08 DIAGNOSIS — Z79899 Other long term (current) drug therapy: Secondary | ICD-10-CM | POA: Diagnosis not present

## 2015-10-08 DIAGNOSIS — R45851 Suicidal ideations: Secondary | ICD-10-CM | POA: Diagnosis not present

## 2015-10-08 DIAGNOSIS — M549 Dorsalgia, unspecified: Secondary | ICD-10-CM

## 2015-10-08 DIAGNOSIS — M1990 Unspecified osteoarthritis, unspecified site: Secondary | ICD-10-CM | POA: Insufficient documentation

## 2015-10-08 DIAGNOSIS — Z8742 Personal history of other diseases of the female genital tract: Secondary | ICD-10-CM | POA: Diagnosis not present

## 2015-10-08 DIAGNOSIS — G8929 Other chronic pain: Secondary | ICD-10-CM | POA: Diagnosis not present

## 2015-10-08 DIAGNOSIS — Z8701 Personal history of pneumonia (recurrent): Secondary | ICD-10-CM | POA: Insufficient documentation

## 2015-10-08 HISTORY — DX: Cervicalgia: M54.2

## 2015-10-08 HISTORY — DX: Other chronic pain: G89.29

## 2015-10-08 LAB — COMPREHENSIVE METABOLIC PANEL
ALK PHOS: 98 U/L (ref 38–126)
ALT: 19 U/L (ref 14–54)
AST: 23 U/L (ref 15–41)
Albumin: 4.3 g/dL (ref 3.5–5.0)
Anion gap: 7 (ref 5–15)
BILIRUBIN TOTAL: 0.3 mg/dL (ref 0.3–1.2)
BUN: 7 mg/dL (ref 6–20)
CO2: 30 mmol/L (ref 22–32)
Calcium: 9 mg/dL (ref 8.9–10.3)
Chloride: 106 mmol/L (ref 101–111)
Creatinine, Ser: 0.82 mg/dL (ref 0.44–1.00)
GFR calc Af Amer: >60 mL/min (ref >60)
Glucose, Bld: 80 mg/dL (ref 65–99)
Potassium: 4 mmol/L (ref 3.5–5.1)
Sodium: 143 mmol/L (ref 135–145)
TOTAL PROTEIN: 7.1 g/dL (ref 6.5–8.1)

## 2015-10-08 LAB — CBC
HEMATOCRIT: 40.8 % (ref 36.0–46.0)
HEMOGLOBIN: 13.5 g/dL (ref 12.0–15.0)
MCH: 31.1 pg (ref 26.0–34.0)
MCHC: 33.1 g/dL (ref 30.0–36.0)
MCV: 94 fL (ref 78.0–100.0)
Platelets: 257 10*3/uL (ref 150–400)
RBC: 4.34 MIL/uL (ref 3.87–5.11)
RDW: 13.3 % (ref 11.5–15.5)
WBC: 9.2 10*3/uL (ref 4.0–10.5)

## 2015-10-08 LAB — ETHANOL

## 2015-10-08 LAB — RAPID URINE DRUG SCREEN, HOSP PERFORMED
AMPHETAMINES: NOT DETECTED
BARBITURATES: NOT DETECTED
Benzodiazepines: NOT DETECTED
Cocaine: NOT DETECTED
Opiates: POSITIVE — AB
TETRAHYDROCANNABINOL: NOT DETECTED

## 2015-10-08 LAB — ACETAMINOPHEN LEVEL: Acetaminophen (Tylenol), Serum: <10 ug/mL — ABNORMAL LOW (ref 10–30)

## 2015-10-08 LAB — SALICYLATE LEVEL: Salicylate Lvl: <4 mg/dL (ref 2.8–30.0)

## 2015-10-08 MED ORDER — BUSPIRONE HCL 5 MG PO TABS
7.5000 mg | ORAL_TABLET | Freq: Two times a day (BID) | ORAL | Status: DC
  Administered 2015-10-09: 7.5 mg via ORAL
  Filled 2015-10-08 (×2): qty 1.5

## 2015-10-08 MED ORDER — PANTOPRAZOLE SODIUM 40 MG PO TBEC: 40.0000 mg | DELAYED_RELEASE_TABLET | Freq: Every day | ORAL | Status: DC

## 2015-10-08 MED ORDER — HYDROCODONE-ACETAMINOPHEN 10-325 MG PO TABS
1.0000 | ORAL_TABLET | Freq: Four times a day (QID) | ORAL | Status: DC | PRN
  Administered 2015-10-09: 1 via ORAL
  Filled 2015-10-08: qty 1

## 2015-10-08 MED ORDER — PREGABALIN 75 MG PO CAPS
150.0000 mg | ORAL_CAPSULE | Freq: Three times a day (TID) | ORAL | Status: DC
  Administered 2015-10-09: 150 mg via ORAL
  Filled 2015-10-08: qty 2

## 2015-10-08 MED ORDER — DULOXETINE HCL 30 MG PO CPEP
60.0000 mg | ORAL_CAPSULE | Freq: Every day | ORAL | Status: DC
  Administered 2015-10-09: 60 mg via ORAL
  Filled 2015-10-08: qty 2

## 2015-10-08 MED ORDER — CYCLOBENZAPRINE HCL 10 MG PO TABS: 10.0000 mg | ORAL_TABLET | Freq: Three times a day (TID) | ORAL | Status: DC | PRN

## 2015-10-08 NOTE — Telephone Encounter (Signed)
Attempted to contact Toni Chan x3 this morning without response. PCP also attempted without response. No ED encounters at [facility] or [facility] noted. [facility] contacted and denies patient encounter in ED last night. [facility] Department contacted and will do a Comptroller on Toni Chan. Police Department to contact us concerning status.

## 2015-10-08 NOTE — Telephone Encounter (Signed)
Attempted to contact patient, no answer, left voicemail for her to return my call. I spoke with Dr. Gerlean Ren who has also no been able to contact the patient, she is going to call the local police department to go to the patient's house and check no her.

## 2015-10-08 NOTE — Telephone Encounter (Signed)
Toni Chan returned call. She described the situation with her brother as outlined in Dr. Johnathan Hausen note. She denies homicidal and suicidal thoughts at this time. Has multiple family members that she has been staying with.

## 2015-10-08 NOTE — ED Provider Notes (Signed)
CSN: [id number]     Arrival date & time 10/08/15  1549 History   First MD Initiated Contact with Patient 10/08/15 1609     Chief Complaint  Patient presents with  . V70.1      HPI  Pt was seen at 1620. Per pt, c/o gradual onset and worsening of persistent anxiety for the past several months, worse over the past several days. Pt states she has been living in her brother's house rent-free "for the past 8 years" and "got a letter from his lawyer yesterday suing me for back rent." Pt states she called her PMD yesterday "because I got overwhelmed." Endorsed fleeting SI yesterday ("slitting wrists"), but denies today. States her PMD called the Police to do a welfare check on her today, and they requested she come to the ED for evaluation. Denies HI, no SA, no hallucinations.    Past Medical History  Diagnosis Date  . IBS (irritable bowel syndrome)   . Chronic back pain   . Cervical cancer ([place]) 1989    Vaginal hysterectomy and oophorectomy 1993  . Kidney stones 2014  . Renal cyst 2014  . Endometriosis   . GERD (gastroesophageal reflux disease)   . Superficial thrombophlebitis   . Placental abruption   . Pilonidal cyst 2015  . Pneumonia   . Anxiety   . Depression   . Arthritis   . Hx MRSA infection 2014    in the area of the scar fr. having coccyx removal   Past Surgical History  Procedure Laterality Date  . Coccyx removal  2014  . Cesarean section  1992  . Back surgery  2013, 2014 X2  . Abdominal hysterectomy      laparoscopic  . Tonsillectomy    . Colonoscopy    . Lumbar fusion  02/27/2015    L4     Family History  Problem Relation Age of Onset  . Depression Mother   . Alcohol abuse Father   . Heart disease Father   . Kidney disease Father   . Alcohol abuse Brother   . Heart disease Brother   . Drug abuse Brother    Social History  Substance Use Topics  . Smoking status: Current Every Day Smoker -- 0.50 packs/day for 3 years    Types: Cigarettes  . Smokeless  tobacco: Never Used  . Alcohol Use: No   OB History    Gravida Para Term Preterm AB TAB SAB Ectopic Multiple Living            2     Review of Systems ROS: Statement: All systems negative except as marked or noted in the HPI; Constitutional: Negative for fever and chills. ; ; Eyes: Negative for eye pain, redness and discharge. ; ; ENMT: Negative for ear pain, hoarseness, nasal congestion, sinus pressure and sore throat. ; ; Cardiovascular: Negative for chest pain, palpitations, diaphoresis, dyspnea and peripheral edema. ; ; Respiratory: Negative for cough, wheezing and stridor. ; ; Gastrointestinal: Negative for nausea, vomiting, diarrhea, abdominal pain, blood in stool, hematemesis, jaundice and rectal bleeding. . ; ; Genitourinary: Negative for dysuria, flank pain and hematuria. ; ; Musculoskeletal: Negative for back pain and neck pain. Negative for swelling and trauma.; ; Skin: Negative for pruritus, rash, abrasions, blisters, bruising and skin lesion.; ; Neuro: Negative for headache, lightheadedness and neck stiffness. Negative for weakness, altered level of consciousness , altered mental status, extremity weakness, paresthesias, involuntary movement, seizure and syncope.; Psych:  +anxiety, +SI. No SA, no  HI, no hallucinations.    Allergies  Review of patient's allergies indicates no known allergies.  Home Medications   Prior to Admission medications   Medication Sig Start Date End Date Taking? Authorizing Provider  busPIRone (BUSPAR) 7.5 MG tablet Take 1 tablet (7.5 mg total) by mouth 2 (two) times daily. 10/04/15   Lupita Dawn, MD  cyclobenzaprine (FLEXERIL) 10 MG tablet Take 1 tablet (10 mg total) by mouth 3 (three) times daily as needed for muscle spasms. 09/06/15   Lupita Dawn, MD  DULoxetine (CYMBALTA) 60 MG capsule TAKE 1 CAPSULE BY MOUTH ONCE A DAY. 05/24/15   Lupita Dawn, MD  glycerin adult (GLYCERIN ADULT) 2 G SUPP Place 1 suppository rectally once. 09/06/15   Lupita Dawn, MD   HYDROcodone-acetaminophen (NORCO) 10-325 MG tablet Take 1 tablet by mouth every 6 (six) hours as needed. 10/04/15   Lupita Dawn, MD  hydrocortisone 2.5 % ointment Apply topically 2 (two) times daily. 06/13/15   Lupita Dawn, MD  omeprazole (PRILOSEC) 20 MG capsule Take 1 capsule (20 mg total) by mouth daily. Patient taking differently: Take 20 mg by mouth daily as needed (patient does not take on a daily basis, only takes when needed.).  12/03/14   Lupita Dawn, MD  polyethylene glycol powder (GLYCOLAX/MIRALAX) powder Take 17 g by mouth daily. 07/29/15   Lupita Dawn, MD  pregabalin (LYRICA) 75 MG capsule Take 150 mg by mouth 3 (three) times daily.     Historical Provider, MD  senna-docusate (SENOKOT-S) 8.6-50 MG tablet Take 2 tablets by mouth daily. 07/05/15   Lupita Dawn, MD   BP 133/75 mmHg  Pulse 96  Temp(Src) 98 F (36.7 C) (Oral)  Resp 18  Ht 5\' 3"  (1.6 m)  Wt 175 lb (79.379 kg)  BMI 31.01 kg/m2  SpO2 100% Physical Exam  1525: Physical examination:  Nursing notes reviewed; Vital signs and O2 SAT reviewed;  Constitutional: Well developed, Well nourished, Well hydrated, In no acute distress; Head:  Normocephalic, atraumatic; Eyes: EOMI, PERRL, No scleral icterus; ENMT: Mouth and pharynx normal, Mucous membranes moist; Neck: Supple, Full range of motion; Cardiovascular: Regular rate and rhythm; Respiratory: Breath sounds clear, No wheezes.  Speaking full sentences with ease, Normal respiratory effort/excursion; Chest: No deformity, Movement normal; Abdomen: Nondistended; Extremities: No deformity.; Neuro: AA&Ox3, Major CN grossly intact.  Speech clear. No gross focal motor deficits in extremities. Climbs on and off stretcher easily by herself. Gait steady.; Skin: Color normal, Warm, Dry.; Psych:  Affect flat.    ED Course  Procedures (including critical care time)  Labs Review  Imaging Review  I have personally reviewed and evaluated these images and lab results as part of my  medical decision-making.   EKG Interpretation None      MDM  MDM Reviewed: previous chart, nursing note and vitals Reviewed previous: labs Interpretation: labs     Results for orders placed or performed during the hospital encounter of 10/08/15  Comprehensive metabolic panel  Result Value Ref Range   Sodium 143 135 - 145 mmol/L   Potassium 4.0 3.5 - 5.1 mmol/L   Chloride 106 101 - 111 mmol/L   CO2 30 22 - 32 mmol/L   Glucose, Bld 80 65 - 99 mg/dL   BUN 7 6 - 20 mg/dL   Creatinine, Ser 0.82 0.44 - 1.00 mg/dL   Calcium 9.0 8.9 - 10.3 mg/dL   Total Protein 7.1 6.5 - 8.1 g/dL   Albumin 4.3  3.5 - 5.0 g/dL   AST 23 15 - 41 U/L   ALT 19 14 - 54 U/L   Alkaline Phosphatase 98 38 - 126 U/L   Total Bilirubin 0.3 0.3 - 1.2 mg/dL   GFR calc non Af Amer >60 >60 mL/min   GFR calc Af Amer >60 >60 mL/min   Anion gap 7 5 - 15  Ethanol (ETOH)  Result Value Ref Range   Alcohol, Ethyl (B) <5 <5 mg/dL  Salicylate level  Result Value Ref Range   Salicylate Lvl 123456 2.8 - 30.0 mg/dL  Acetaminophen level  Result Value Ref Range   Acetaminophen (Tylenol), Serum <10 (L) 10 - 30 ug/mL  CBC  Result Value Ref Range   WBC 9.2 4.0 - 10.5 K/uL   RBC 4.34 3.87 - 5.11 MIL/uL   Hemoglobin 13.5 12.0 - 15.0 g/dL   HCT 40.8 36.0 - 46.0 %   MCV 94.0 78.0 - 100.0 fL   MCH 31.1 26.0 - 34.0 pg   MCHC 33.1 30.0 - 36.0 g/dL   RDW 13.3 11.5 - 15.5 %   Platelets 257 150 - 400 K/uL  Urine rapid drug screen (hosp performed) (Not at [facility])  Result Value Ref Range   Opiates POSITIVE (A) NONE DETECTED   Cocaine NONE DETECTED NONE DETECTED   Benzodiazepines NONE DETECTED NONE DETECTED   Amphetamines NONE DETECTED NONE DETECTED   Tetrahydrocannabinol NONE DETECTED NONE DETECTED   Barbiturates NONE DETECTED NONE DETECTED    1750:  TTS eval pending.   2340:  TTS eval continues pending. Holding orders written.   Francine Graven, DO 10/08/15 2342

## 2015-10-08 NOTE — Telephone Encounter (Signed)
Spoke to [facility] and informed them that patient returned call and that she does not require a Comptroller.

## 2015-10-08 NOTE — ED Notes (Signed)
Receiving TTS

## 2015-10-08 NOTE — Telephone Encounter (Signed)
Attempted to contact patient via telephone, no answer.

## 2015-10-08 NOTE — ED Notes (Signed)
Pt states that she got a letter yesterday that her brother is suing her for back rent and she is being evicted.  States that she called her doctor because her anxiety is very bad.  Pt expressed to her doctor that she thought about cutting her wrists but states that she did not act on it.  States that since she did not go to her doctor her doctor called the police to check on her and they made her come for evaluation.  Pt is not under IVC at this time.  Very tearful at triage.

## 2015-10-09 ENCOUNTER — Telehealth: Payer: Self-pay | Admitting: Family Medicine

## 2015-10-09 MED ORDER — BUSPIRONE HCL 5 MG PO TABS
ORAL_TABLET | ORAL | Status: AC
  Filled 2015-10-09: qty 2

## 2015-10-09 NOTE — Telephone Encounter (Signed)
Pt called because she said that her situation escalated last night and she had to be taken to [facility] for her issues. She was released around 1 AM and the police took her home and she feels okay. She was told that she needs to follow up with her PCP as soon as she can. She will be here Friday to see Brad. She would like to speak to Dr. Ree Kida  If possible today. jw

## 2015-10-09 NOTE — ED Provider Notes (Signed)
1:00 AM  Assumed care from Dr. Thurnell Garbe.  Pt is a 50 y.o. female with history of anxiety who presents to the emergency department with fleeting suicidal thoughts yesterday. States she called her primary care physician yesterday because she was overwhelmed because of issues with her possibly being evicted from her current living situation. Her PMD called the police to do well for check brought her to the emergency department for evaluation. Denies HI, SI, hallucinations. States she's never had a previous suicide attempt. States she has follow-up with a psychiatrist on Friday. She has been evaluated by TTS. Mary with TTS feels patient can be discharged. I have assess patient myself and she contracts for safety. She states that her mother committed suicide and that she could "never do that to anyone in her family or her children". She agrees that if she has any concerning symptoms she will need to return to the hospital. She denies any current symptoms and states that she is feeling much better and has outpatient follow-up. Discussed return precautions. She verbalizes understanding and is comfortable at this plan.  [place], DO 10/09/15 0122

## 2015-10-09 NOTE — Telephone Encounter (Signed)
Returned patient call. She was taken to ED last evening by police (due to call from Dr. Gerlean Ren yesterday). Evaluated and no suicidal thoughts. Anxiety improved today.   Back pain is flaring due to ride on police car. Took Lyrica, Flexeril, and Norco. Trying heating pad.   Patient has follow up on Friday with psychology student. No need for PCP follow up at this time.

## 2015-10-09 NOTE — Discharge Instructions (Signed)
Generalized Anxiety Disorder Generalized anxiety disorder (GAD) is a mental disorder. It interferes with life functions, including relationships, work, and school. GAD is different from normal anxiety, which everyone experiences at some point in their lives in response to specific life events and activities. Normal anxiety actually helps [place] prepare for and get through these life events and activities. Normal anxiety goes away after the event or activity is over.  GAD causes anxiety that is not necessarily related to specific events or activities. It also causes excess anxiety in proportion to specific events or activities. The anxiety associated with GAD is also difficult to control. GAD can vary from mild to severe. People with severe GAD can have intense waves of anxiety with physical symptoms (panic attacks).  SYMPTOMS The anxiety and worry associated with GAD are difficult to control. This anxiety and worry are related to many life events and activities and also occur more days than not for 6 months or longer. People with GAD also have three or more of the following symptoms (one or more in children):  Restlessness.   Fatigue.  Difficulty concentrating.   Irritability.  Muscle tension.  Difficulty sleeping or unsatisfying sleep. DIAGNOSIS GAD is diagnosed through an assessment by your health care provider. Your health care provider will ask you questions aboutyour mood,physical symptoms, and events in your life. Your health care provider may ask you about your medical history and use of alcohol or drugs, including prescription medicines. Your health care provider may also do a physical exam and blood tests. Certain medical conditions and the use of certain substances can cause symptoms similar to those associated with GAD. Your health care provider may refer you to a mental health specialist for further evaluation. TREATMENT The following therapies are usually used to treat GAD:    Medication. Antidepressant medication usually is prescribed for long-term daily control. Antianxiety medicines may be added in severe cases, especially when panic attacks occur.   Talk therapy (psychotherapy). Certain types of talk therapy can be helpful in treating GAD by providing support, education, and guidance. A form of talk therapy called cognitive behavioral therapy can teach you healthy ways to think about and react to daily life events and activities.  Stress managementtechniques. These include yoga, meditation, and exercise and can be very helpful when they are practiced regularly. A mental health specialist can help determine which treatment is best for you. Some people see improvement with one therapy. However, other people require a combination of therapies.   This information is not intended to replace advice given to you by your health care provider. Make sure you discuss any questions you have with your health care provider.   Document Released: 01/09/2013 Document Revised: 10/05/2014 Document Reviewed: 01/09/2013 Elsevier Interactive Patient Education 2016 [place] A no-harm Surveyor, mining is a written or verbal agreement between you and a mental health professional to promote safety. It contains specific actions and promises you agree to. The agreement also includes instructions from the therapist or doctor. The instructions will help prevent you from harming yourself or harming others. Harm can be as mild as pinching yourself, but can increase in intensity to actions like burning or cutting yourself. The extreme level of self-harm would be committing suicide. No-harm safety contracts are also sometimes referred to as a Radiographer, therapeutic, suicide Electrical engineer, no-harm agreements or decisions, or a Surveyor, mining.  REASONS FOR NO-HARM SAFETY CONTRACTS Safety contracts are just one part of an overall treatment plan  to help keep you  safe and free of harm. A safety contract may help to relieve anxiety, restore a sense of control, state clearly the alternatives to harm or suicide, and give you and your therapist or doctor a gauge for how you are doing in between visits. Many factors impact the decision to use a no-harm safety contract and its effectiveness. A proper overall treatment plan and evaluation and good patient understanding are the keys to good outcomes. CONTRACT ELEMENTS  A contract can range from simple to complex. They include all or some of the following:  Action statements. These are statements you agree to do or not do. Example: If I feel my life is becoming too difficult, I agree to do the following so there is no harm to myself or others:  Talk with family or friends.  Rid myself of all things that I could use to harm myself.  Do an activity I enjoy or have enjoyed in the recent past. Coping strategies. These are ways to think and feel that decrease stress, such as:  Use of affirmations or positive statements about self.  Good self-care, including improved grooming, and healthy eating, and healthy sleeping patterns.  Increase physical exercise.  Increase social involvement.  Focus on positive aspects of life. Crisis management. This would include what to do if there was trouble following the contract or an urge to harm. This might include notifying family or your therapist of suicidal thoughts. Be open and honest about suicidal urges. To prevent a crisis, do the following:  List reasons to reach out for support.  Keep contact numbers and available hours handy. Treatment goals. These are goals would include no suicidal thoughts, improved mood, and feelings of hopefulness. Listed responsibilities of different people involved in care. This could include family members. A family member may agree to remove firearms or other lethal weapons/substances from your ease of access. A timeline. A timeline can be  in place from one therapy session to the next session. HOME CARE INSTRUCTIONS   Follow your no-harm safety contract.  Contact your therapist and/or doctor if you have any questions or concerns. MAKE SURE YOU:   Understand these instructions.  Will watch your condition. Noticing any mood changes or suicidal urges.  Will get help right away if you are not doing well or get worse.   This information is not intended to replace advice given to you by your health care provider. Make sure you discuss any questions you have with your health care provider.   Document Released: 03/04/2010 Document Revised: 10/05/2014 Document Reviewed: 03/04/2010 Elsevier Interactive Patient Education 2016 [employer].    Emergency Department Resource Guide 1) Find a Doctor and Pay Out of Pocket Although you won't have to find out who is covered by your insurance plan, it is a good idea to ask around and get recommendations. You will then need to call the office and see if the doctor you have chosen will accept you as a new patient and what types of options they offer for patients who are self-pay. Some doctors offer discounts or will set up payment plans for their patients who do not have insurance, but you will need to ask so you aren't surprised when you get to your appointment.  2) Contact Your Local Health Department Not all health departments have doctors that can see patients for sick visits, but many do, so it is worth a call to see if yours does. If you don't know where  your local health department is, you can check in your phone book. The CDC also has a tool to help you locate your state's health department, and many state websites also have listings of all of their local health departments.  3) Find a [facility] If your illness is not likely to be very severe or complicated, you may want to try a walk in clinic. These are popping up all over the country in pharmacies, drugstores, and shopping  centers. They're usually staffed by nurse practitioners or physician assistants that have been trained to treat common illnesses and complaints. They're usually fairly quick and inexpensive. However, if you have serious medical issues or chronic medical problems, these are probably not your best option.  No Primary Care Doctor: - Call Health Connect at  [phone] - they can help you locate a primary care doctor that  accepts your insurance, provides certain services, etc. - Physician Referral Service- [phone]  Chronic Pain Problems: Organization         Address  Phone   Notes  [facility]  [phone] Patients need to be referred by their primary care doctor.   Medication Assistance: Organization         Address  Phone   Notes  [facility] Medication [facility] [address] [place], [place] [address] [phone] --Must be a resident of [facility] -- Must have NO insurance coverage whatsoever (no Medicaid/ Medicare, etc.) -- The pt. MUST have a primary care doctor that directs their care regularly and follows them in the community   MedAssist  [phone]   [employer]  [phone]    Agencies that provide inexpensive medical care: Organization         Address  Phone   Notes  [place]  [phone]   [facility]    [phone]   [facility] [place], [place] [address] [phone]   [place] [address] [place] [phone]   Planned Parenthood    [phone]   [facility]    [phone]   [place] and [address], [place] Phone:  [phone], Fax:  [phone] Hours of Operation:  9 am - 6 pm, M-F.  Also accepts Medicaid/Medicare and self-pay.  [facility] for [place] [address], [address], [place] Phone: [phone], Fax: [phone]. Hours of Operation:  8:30 am - 5:30 pm, M-F.  Also accepts Medicaid and self-pay.  [facility] [address] [place] Phone: [phone]   [place], [place], [place] [phone], Ext. [phone] Mondays & Thursdays: 7-9 AM.  First 15 patients are seen on a first come, first serve basis.    [place] Providers:  Organization         Address  Phone   Notes  [facility] [address], [place] [phone] Also accepts self-pay patients.  [place], [place]  [phone]   [place], [address], [place] [phone]   [facility] [address], [place] [phone]   Lucianne Lei [address], [place]   [phone] Only accepts [place] Access [place] patients  after they have their name applied to their card.   Self-Pay (no insurance) in [facility]:  Organization         Address  Phone   Notes  Sickle Cell Patients, [facility] [place] [phone]   [facility] [place] [phone]   [facility] [place]  [place], [place], [place] [phone]   [facility]/Dr. Osei-Bonsu  [address], [place] or [address], [place] [phone] Phone number for both [place] and [place] locations is the same.  Urgent Medical and [facility] [address] [place] [phone]   [facility] [address], [place] or [address] [phone]   [facility] [address], [place] [phone], phone; [phone], fax Sees patients 1st and 3rd Saturday of every month.  Must not qualify for public or private insurance (i.e.  Medicaid, Medicare, [place] Health Choice, Veterans' Benefits)  Household income should be no more than 200% of the poverty level The clinic cannot treat you if you are pregnant or think you are pregnant  Sexually transmitted diseases are not treated at the clinic.    Dental Care: Organization         Address  Phone  Notes  [facility] Department of [facility] [phone] Accepts children up to age 32 who are enrolled in [place] or [place]; pregnant women with a Medicaid card; and children who have applied for Medicaid or [place] Health Choice, but were declined, whose parents can pay a reduced fee at time of service.  [facility] Department of [facility]  [address], [place] [phone] Accepts children up to age 48 who are enrolled in [place] or [place]; pregnant women with a Medicaid card; and children who have applied for Medicaid or [place] Health Choice, but were declined, whose parents can pay a reduced fee at time of service.  [place] Adult Dental Access PROGRAM  [place] [phone] Patients are seen by appointment only. Walk-ins are not accepted. [place] will see patients 53 years of age and older. Monday - Tuesday (8am-5pm) Most Wednesdays (8:30-5pm) $30 per visit, cash only  [facility] Adult Dental Access PROGRAM  [address], [facility] [phone] Patients are seen by appointment only. Walk-ins are not accepted. [place] will see patients 42 years of age and older. One Wednesday Evening (Monthly: Volunteer Based).  $30 per visit, cash only  [place]  [phone] for adults; Children under age 46, call Graduate Pediatric Dentistry at [phone]. Children aged 46-14, please call [phone] to request a pediatric application.  Dental services are provided in all areas of dental care including fillings,  crowns and bridges, complete and partial dentures, implants, gum treatment, root canals, and extractions. Preventive care is also provided. Treatment is provided to both adults and children. Patients are selected via a lottery and there is often a waiting list.   [facility] [address], [place]  [phone] [email or web address], [place], [place] [phone], Ext. [phone] Second and Fourth Thursday of each month, opens at 6:30 AM; Clinic ends at 9 AM.  Patients  are seen on a first-come first-served basis, and a limited number are seen during each clinic.   [facility]  [address] Hillard Danker [place], [place] [phone]   Eligibility Requirements You must have lived in [place], [place], or [place] counties for at least the last three months.   You cannot be eligible for state or federal sponsored [employer], including [employer], [place], or [employer].   You generally cannot be eligible for healthcare insurance through your employer.    How to apply: Eligibility screenings are held every Tuesday and Wednesday afternoon from 1:00 pm until 4:00 pm. You do not need an appointment for the interview!  [facility] [address], [place], [place]   [place]  Dent Department  [place]  [phone]    Behavioral Health Resources in the Community: Intensive Outpatient Programs Organization         Address  Phone  Notes  [place]. [address] [place], [place] [phone]   [facility] Outpatient [address] [place], [place]   ADS: Alcohol & Drug S[facility]cs [address] [place], [place]   [place] [address],  [place], [place] or [phone]   Substance Abuse  Resources Organization         Address  Phone  Notes  Alcohol and Drug Services  [phone]   [place]  [phone]   [facility] [place]   Chinita Pester  [phone]   Residential & Outpatient Substance Abuse Program  [phone]   Psychological Services Organization         Address  Phone  Notes  [facility]  [place]  [phone]   [place] [address] [place] or [phone]    Mobile Crisis Teams Organization         Address  Phone  Notes  [facility], Mobile Crisis Care Unit  [phone]   [facility]  [address] [place], [place]   [place] [address] [place] [phone]    Self-Help/Support Groups Organization         Address  Phone             Notes  [place]. of [place] - variety of support groups  [place] Call for more information  Narcotics Anonymous (NA), [facility] [address], [employer] [place]  2 meetings at this location   Special educational needs teacher         Address  Phone  Notes  ASAP Residential Treatment [place],    [place]  [phone]   [facility]  [address], [place] [id number], [place], [place]   [place], [place] [phone] Admissions: 8am-3pm M-F  Incentives Substance [place] [address],    [place], [place] [phone]   [facility] [address] Jadene Pierini [place], [place]   [facility] [address]  [place], [place]   [facility] - Intensive Outpatient [address] Kristeen [address], [place], [place]   [facility] ([place] [place].,  [place], [place] or [phone]   [facility]idential Treatment Services (RTS) [address]  Barbara Cower [place], [place] Accepts Medicaid  [facility] [place] [address],  [place] [phone] Substance Abuse/Addiction Treatment   [facility] Organization         Address  Phone  Notes  [facility]  [phone]   Domenic Schwab, PhD [address] Arlis Porta [place], [place]   [phone] or [phone]   [place], [place] [phone]   [address], [place], [place] [phone] Insurance/Medicaid/sponsorship through [facility] and Families [address] [place]                                    [place], [place] [phone] [place] [address], [place] [phone]    Dr. Adele Schilder  [phone]   [facility] Dept. 1) [address], [place] 2) [place] 3)  [place] [address], [place] [phone]  [phone]   [place] [phone] or [phone] (After Hours)
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
## 2015-10-11 ENCOUNTER — Ambulatory Visit (INDEPENDENT_AMBULATORY_CARE_PROVIDER_SITE_OTHER): Payer: No Typology Code available for payment source | Admitting: Psychology

## 2015-10-11 DIAGNOSIS — F329 Major depressive disorder, single episode, unspecified: Secondary | ICD-10-CM

## 2015-10-11 DIAGNOSIS — F418 Other specified anxiety disorders: Secondary | ICD-10-CM

## 2015-10-11 DIAGNOSIS — F32A Depression, unspecified: Secondary | ICD-10-CM

## 2015-10-11 DIAGNOSIS — F419 Anxiety disorder, unspecified: Principal | ICD-10-CM

## 2015-10-11 NOTE — Progress Notes (Signed)
Reason for follow-up: Toni Chan followed up with [facility] to continue improving symptoms of anxiety, and to monitor mood following evaluation in the ED earlier this week for suicidal ideation.  Issues discussed: Toni Chan discussed her current mood, and several important events over the week that influenced her mood.  Identified goals: Toni Chan would like to improve her symptoms of anxiety.

## 2015-10-11 NOTE — Assessment & Plan Note (Signed)
Patient reported that on 10/07/15, she received a letter from an attorney stating that she owed $3,000 in "back rent" to her brother, and that she must pay this money by the end of the month or face a lawsuit. As a result, she experienced thoughts of cutting her wrists, and called the on-call number at [facility]. Following a series of calls and messages, the police performed a welfare check, where a Sheriff's deputy called an ambulance to take her to [facility] for an evaluation. She was not deemed to be actively suicidal, and was driven home in the back of a police car, which she reports resulted in back pain (due to the small amount of leg space in the back of the car). The next day, she received a notice that her brother was evicting her from the house, which again made her upset, although she denied SI/HI. Patient's mood improved on 10/10/15 when she received notice that her application for disability was approved. Patient was very excited to inform [facility] and Dr. Ree Kida of this development, and expressed gratitude for this development. [facility] offered patient information for legal aid, and also offered information for financial counseling resources. Patient was grateful for offers, but declined financial counseling information at this time. Patient will follow-up with [facility] in one week to continue monitoring mood.

## 2015-10-18 ENCOUNTER — Encounter: Payer: Self-pay | Admitting: Family Medicine

## 2015-10-18 ENCOUNTER — Ambulatory Visit: Payer: No Typology Code available for payment source

## 2015-10-22 ENCOUNTER — Encounter (HOSPITAL_COMMUNITY): Payer: Self-pay

## 2015-10-22 ENCOUNTER — Emergency Department (HOSPITAL_COMMUNITY)
Admission: EM | Admit: 2015-10-22 | Discharge: 2015-10-23 | Disposition: A | Discharge status: Home / Self Care | Payer: Medicaid Other | Attending: Emergency Medicine | Admitting: Emergency Medicine

## 2015-10-22 DIAGNOSIS — Z872 Personal history of diseases of the skin and subcutaneous tissue: Secondary | ICD-10-CM | POA: Diagnosis not present

## 2015-10-22 DIAGNOSIS — Y998 Other external cause status: Secondary | ICD-10-CM | POA: Insufficient documentation

## 2015-10-22 DIAGNOSIS — Z79899 Other long term (current) drug therapy: Secondary | ICD-10-CM | POA: Diagnosis not present

## 2015-10-22 DIAGNOSIS — Z8742 Personal history of other diseases of the female genital tract: Secondary | ICD-10-CM | POA: Insufficient documentation

## 2015-10-22 DIAGNOSIS — M5416 Radiculopathy, lumbar region: Secondary | ICD-10-CM | POA: Diagnosis not present

## 2015-10-22 DIAGNOSIS — K219 Gastro-esophageal reflux disease without esophagitis: Secondary | ICD-10-CM | POA: Diagnosis not present

## 2015-10-22 DIAGNOSIS — Z8614 Personal history of Methicillin resistant Staphylococcus aureus infection: Secondary | ICD-10-CM | POA: Insufficient documentation

## 2015-10-22 DIAGNOSIS — G8929 Other chronic pain: Secondary | ICD-10-CM | POA: Insufficient documentation

## 2015-10-22 DIAGNOSIS — F419 Anxiety disorder, unspecified: Secondary | ICD-10-CM | POA: Diagnosis not present

## 2015-10-22 DIAGNOSIS — W1839XA Other fall on same level, initial encounter: Secondary | ICD-10-CM | POA: Diagnosis not present

## 2015-10-22 DIAGNOSIS — Z8679 Personal history of other diseases of the circulatory system: Secondary | ICD-10-CM | POA: Diagnosis not present

## 2015-10-22 DIAGNOSIS — Y9289 Other specified places as the place of occurrence of the external cause: Secondary | ICD-10-CM | POA: Diagnosis not present

## 2015-10-22 DIAGNOSIS — Q6100 Congenital renal cyst, unspecified: Secondary | ICD-10-CM | POA: Diagnosis not present

## 2015-10-22 DIAGNOSIS — Z792 Long term (current) use of antibiotics: Secondary | ICD-10-CM | POA: Insufficient documentation

## 2015-10-22 DIAGNOSIS — Y9389 Activity, other specified: Secondary | ICD-10-CM | POA: Insufficient documentation

## 2015-10-22 DIAGNOSIS — Z8701 Personal history of pneumonia (recurrent): Secondary | ICD-10-CM | POA: Diagnosis not present

## 2015-10-22 DIAGNOSIS — S3992XA Unspecified injury of lower back, initial encounter: Secondary | ICD-10-CM | POA: Diagnosis present

## 2015-10-22 DIAGNOSIS — Z9889 Other specified postprocedural states: Secondary | ICD-10-CM | POA: Diagnosis not present

## 2015-10-22 DIAGNOSIS — Z8541 Personal history of malignant neoplasm of cervix uteri: Secondary | ICD-10-CM | POA: Diagnosis not present

## 2015-10-22 DIAGNOSIS — M545 Low back pain: Secondary | ICD-10-CM | POA: Diagnosis not present

## 2015-10-22 MED ORDER — DEXAMETHASONE SODIUM PHOSPHATE 10 MG/ML IJ SOLN
10.0000 mg | Freq: Once | INTRAMUSCULAR | Status: AC
  Administered 2015-10-23: 10 mg via INTRAMUSCULAR
  Filled 2015-10-22: qty 1

## 2015-10-22 MED ORDER — DIAZEPAM 5 MG/ML IJ SOLN
5.0000 mg | Freq: Once | INTRAMUSCULAR | Status: AC
  Administered 2015-10-23: 5 mg via INTRAMUSCULAR
  Filled 2015-10-22: qty 2

## 2015-10-22 MED ORDER — KETOROLAC TROMETHAMINE 60 MG/2ML IM SOLN
60.0000 mg | Freq: Once | INTRAMUSCULAR | Status: AC
  Administered 2015-10-23: 60 mg via INTRAMUSCULAR
  Filled 2015-10-22: qty 2

## 2015-10-22 NOTE — ED Notes (Addendum)
Pt reports h/o back pain, onset 1 week ago fell on buttocks on floor, now pain on right lower back is unbearable.  Has to use cane and is stumbling, walking only short distances.  New numbness and tingling in right leg.  Pt tearful at triage.

## 2015-10-22 NOTE — ED Notes (Signed)
Took Lyrica x 2 and hydrocodone 10 mg @ 3pm.
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
## 2015-10-23 ENCOUNTER — Telehealth: Payer: Self-pay | Admitting: Family Medicine

## 2015-10-23 ENCOUNTER — Other Ambulatory Visit: Payer: Self-pay | Admitting: Family Medicine

## 2015-10-23 MED ORDER — METHYLPREDNISOLONE 4 MG PO TBPK: ORAL_TABLET | ORAL | Status: DC

## 2015-10-23 NOTE — Discharge Instructions (Signed)
Use ice and heat over the painful areas. Continue your medications. Finish the medrol dose pack. Keep your appointment with your primary care doctor in 2 days to discuss your pain medications.

## 2015-10-23 NOTE — ED Notes (Signed)
5mg  Valium wasted in sink with Janett Billow, RN

## 2015-10-23 NOTE — Telephone Encounter (Signed)
Was seen at Ed last night, back pain and leg weakness. Patient states pain is now unbearable and would like to speak to Dr. Ree Kida if possible. Patient has an appt with him on 10/25/15. Please advise.

## 2015-10-23 NOTE — ED Notes (Signed)
Pt A&OX4, ambulatory at d/c with steady gait, NAD and pt states she has all of her belongings with her at d/c

## 2015-10-23 NOTE — Telephone Encounter (Signed)
Returned patient call.  She recently received a "termination of lease notice" from her brother. She does remember ever signing a lease. She has to be out of the house by the end of the week. She had to start moving belongings out of her house by herself. This caused worsening of her back pain and radicular symptoms. Went to ED last evening at [facility]. Received a Toradol shot and Valium. This helped her symptoms for a short period of time. Also was prescribed a Medrol Dose pack (still needs to pick up). Has upcoming appointment on 1/27. Will continue current regimen of Hydrocodone and Lyrica until appointment.

## 2015-10-25 ENCOUNTER — Encounter: Payer: Self-pay | Admitting: Psychology

## 2015-10-25 ENCOUNTER — Ambulatory Visit (INDEPENDENT_AMBULATORY_CARE_PROVIDER_SITE_OTHER): Payer: No Typology Code available for payment source | Admitting: Psychology

## 2015-10-25 ENCOUNTER — Ambulatory Visit (INDEPENDENT_AMBULATORY_CARE_PROVIDER_SITE_OTHER): Payer: No Typology Code available for payment source | Admitting: Family Medicine

## 2015-10-25 DIAGNOSIS — G8929 Other chronic pain: Secondary | ICD-10-CM

## 2015-10-25 DIAGNOSIS — Z7189 Other specified counseling: Secondary | ICD-10-CM

## 2015-10-25 DIAGNOSIS — F419 Anxiety disorder, unspecified: Principal | ICD-10-CM

## 2015-10-25 DIAGNOSIS — F418 Other specified anxiety disorders: Secondary | ICD-10-CM

## 2015-10-25 DIAGNOSIS — F32A Depression, unspecified: Secondary | ICD-10-CM

## 2015-10-25 DIAGNOSIS — M5136 Other intervertebral disc degeneration, lumbar region: Secondary | ICD-10-CM

## 2015-10-25 DIAGNOSIS — F329 Major depressive disorder, single episode, unspecified: Secondary | ICD-10-CM

## 2015-10-25 MED ORDER — IBUPROFEN 800 MG PO TABS: 800.0000 mg | ORAL_TABLET | Freq: Three times a day (TID) | ORAL | Status: DC | PRN

## 2015-10-25 MED ORDER — DICLOFENAC SODIUM 75 MG PO TBEC: 75.0000 mg | DELAYED_RELEASE_TABLET | Freq: Two times a day (BID) | ORAL | Status: DC

## 2015-10-25 MED ORDER — HYDROCODONE-ACETAMINOPHEN 10-325 MG PO TABS: 1.0000 | ORAL_TABLET | Freq: Four times a day (QID) | ORAL | Status: DC | PRN

## 2015-10-25 NOTE — Patient Instructions (Signed)
It was nice to see you today.  Start Ibuprofen 800 mg three times per day in addition to your current pain medications.
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
## 2015-10-25 NOTE — Progress Notes (Signed)
Reason for follow-up:  Breazia followed up due to continued life stress and difficulties with symptoms of depression and anxiety  Issues discussed: Difficulties with family, uncertainty about her housing situation, stress management.  Identified goals: Isra would like to improve her symptoms of anxiety and depression.

## 2015-10-25 NOTE — Assessment & Plan Note (Signed)
Refill of Norco provided. Started Ibuprofen 800 mg TID for acute exacerbation of back pain.

## 2015-10-25 NOTE — Assessment & Plan Note (Signed)
Acute exacerbation of low back pain. -continue current pain regimen -add Ibuprofen 800 mg TID

## 2015-10-25 NOTE — Progress Notes (Signed)
   Subjective:    Patient ID: Toni Chan, female    DOB: 11-18-1965, 50 y.o.   MRN: [id number]  HPI 50 y/o female presents for follow up of chronic back pain.  Acute on chronic back pain - patient recently was told by brother that she needs to move out of his home, had to start moving personal items out of house, had exacerbation of pain, increased radiation of pain down right leg, no bladder or bowel incontinence, seen recently in ED and given Medrol Dose Pack (little improvement). Taking Norco 10-325 QID, Cymbalta 60 mg daily, Flexeril 10 mg TID, and Lyrica 150 mg BID.   Anxiety/Depression - patient continue to have severe anxiety and depression related to current social situation, currently awaiting Disability to start, seen by Elton Sin for CBT (please refer to his note).   Review of Systems  Constitutional: Negative for fever and chills.  Musculoskeletal: Positive for back pain.       Objective:   Physical Exam  Gen: pleasant female, mild distress due to pain MSK: diffuse tenderness and muscle spasm of lumbar spine, midline incision present from previous back surgeries, using cane to help with ambulation Psych: well dressed, mood is depressed, affect is flat Neuro: LE strength grossly 5/5     Assessment & Plan:  Encounter for chronic pain management Refill of Norco provided. Started Ibuprofen 800 mg TID for acute exacerbation of back pain.   Lumbar degenerative disc disease Acute exacerbation of low back pain. -continue current pain regimen -add Ibuprofen 800 mg TID

## 2015-10-30 ENCOUNTER — Ambulatory Visit: Payer: No Typology Code available for payment source

## 2015-10-31 NOTE — Assessment & Plan Note (Signed)
See progress note and PHQ-9 assessment.

## 2015-11-02 ENCOUNTER — Other Ambulatory Visit: Payer: Self-pay | Admitting: Family Medicine

## 2015-11-05 ENCOUNTER — Other Ambulatory Visit: Payer: Self-pay | Admitting: *Deleted

## 2015-11-05 DIAGNOSIS — G8929 Other chronic pain: Secondary | ICD-10-CM

## 2015-11-06 ENCOUNTER — Telehealth: Payer: Self-pay | Admitting: Family Medicine

## 2015-11-06 MED ORDER — TRAMADOL HCL 50 MG PO TABS: 50.0000 mg | ORAL_TABLET | Freq: Three times a day (TID) | ORAL | Status: DC | PRN

## 2015-11-06 NOTE — Telephone Encounter (Signed)
Gabapentin is the preferred medication for medicaid.  Medication is not listed on current med list.  Derl Barrow, RN

## 2015-11-06 NOTE — Telephone Encounter (Signed)
RN staff - please call in Tramadol 50 mg Q8 hours prn pain, dispense #90, refill #0, thanks

## 2015-11-06 NOTE — Telephone Encounter (Signed)
Pt needs refill on Lyrica or gabapentin, whichever medicaid will cover. Send to [facility] in [place].  Toni Chan, ASA

## 2015-11-07 MED ORDER — GABAPENTIN 300 MG PO CAPS: 600.0000 mg | ORAL_CAPSULE | Freq: Three times a day (TID) | ORAL | Status: DC

## 2015-11-07 NOTE — Telephone Encounter (Signed)
Changed Lyrica to Gabapentin (formulary change). Updated medication list in EPIC.

## 2015-11-07 NOTE — Telephone Encounter (Signed)
Rx called into [facility]. 

## 2015-11-14 ENCOUNTER — Ambulatory Visit: Payer: No Typology Code available for payment source | Admitting: Family Medicine

## 2015-11-15 ENCOUNTER — Ambulatory Visit (INDEPENDENT_AMBULATORY_CARE_PROVIDER_SITE_OTHER): Payer: Medicare Other | Admitting: Family Medicine

## 2015-11-15 ENCOUNTER — Encounter: Payer: Self-pay | Admitting: Family Medicine

## 2015-11-15 VITALS — BP 144/88 | HR 73 | Temp 98.2°F | Ht 63.0 in | Wt 181.3 lb

## 2015-11-15 DIAGNOSIS — Z7189 Other specified counseling: Secondary | ICD-10-CM

## 2015-11-15 DIAGNOSIS — Z0000 Encounter for general adult medical examination without abnormal findings: Secondary | ICD-10-CM | POA: Diagnosis not present

## 2015-11-15 DIAGNOSIS — M5136 Other intervertebral disc degeneration, lumbar region: Secondary | ICD-10-CM | POA: Diagnosis not present

## 2015-11-15 DIAGNOSIS — F418 Other specified anxiety disorders: Secondary | ICD-10-CM

## 2015-11-15 DIAGNOSIS — F329 Major depressive disorder, single episode, unspecified: Secondary | ICD-10-CM

## 2015-11-15 DIAGNOSIS — F419 Anxiety disorder, unspecified: Principal | ICD-10-CM

## 2015-11-15 DIAGNOSIS — F32A Depression, unspecified: Secondary | ICD-10-CM

## 2015-11-15 DIAGNOSIS — G8929 Other chronic pain: Secondary | ICD-10-CM

## 2015-11-15 MED ORDER — HYDROCODONE-ACETAMINOPHEN 10-325 MG PO TABS: 1.0000 | ORAL_TABLET | Freq: Four times a day (QID) | ORAL | Status: DC | PRN

## 2015-11-15 MED ORDER — GABAPENTIN 300 MG PO CAPS: 600.0000 mg | ORAL_CAPSULE | Freq: Three times a day (TID) | ORAL | Status: DC

## 2015-11-15 NOTE — Assessment & Plan Note (Signed)
See note from Encounter for Chronic Pain Management.

## 2015-11-15 NOTE — Patient Instructions (Signed)
It was nice to see you today.  I am happy to hear that you have your own place now.  Please make and appointment for a yearly physical in the next 1-2 months.

## 2015-11-15 NOTE — Progress Notes (Signed)
Reason for follow-up: Toni Chan wanted to follow up to continue monitoring her social situation and improve symptoms of anxiety.  Issues discussed: Current social situation.  Identified goals: Patient would like to reduce symptoms of anxiety, manage chronic pain, and find a house.

## 2015-11-15 NOTE — Assessment & Plan Note (Signed)
Patient to schedule preventative visit.
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
## 2015-11-15 NOTE — Assessment & Plan Note (Signed)
Refill of Norco provided. Pain is stable on current regimen of Norco.  -change Lyrica to Gabapentin (due to change in insurance) -continue Flexeril prn
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
## 2015-11-26 ENCOUNTER — Other Ambulatory Visit: Payer: Self-pay | Admitting: Family Medicine

## 2015-12-04 ENCOUNTER — Other Ambulatory Visit: Payer: Self-pay | Admitting: Family Medicine

## 2015-12-04 DIAGNOSIS — F329 Major depressive disorder, single episode, unspecified: Secondary | ICD-10-CM

## 2015-12-04 DIAGNOSIS — F32A Depression, unspecified: Secondary | ICD-10-CM

## 2015-12-04 MED ORDER — BUSPIRONE HCL 7.5 MG PO TABS: 7.5000 mg | ORAL_TABLET | Freq: Two times a day (BID) | ORAL | Status: DC

## 2015-12-04 NOTE — Telephone Encounter (Signed)
Needs refill on buspar.  [employer].  Has enough to take one tonight and one in the morning

## 2015-12-12 ENCOUNTER — Other Ambulatory Visit: Payer: Self-pay | Admitting: Family Medicine

## 2015-12-12 ENCOUNTER — Encounter: Payer: Self-pay | Admitting: Obstetrics and Gynecology

## 2015-12-12 ENCOUNTER — Ambulatory Visit (INDEPENDENT_AMBULATORY_CARE_PROVIDER_SITE_OTHER): Payer: Medicare Other | Admitting: Obstetrics and Gynecology

## 2015-12-12 VITALS — BP 128/80 | HR 90 | Temp 98.2°F | Wt 183.0 lb

## 2015-12-12 DIAGNOSIS — R6889 Other general symptoms and signs: Secondary | ICD-10-CM | POA: Diagnosis present

## 2015-12-12 MED ORDER — DM-GUAIFENESIN ER 30-600 MG PO TB12: 1.0000 | ORAL_TABLET | Freq: Two times a day (BID) | ORAL | Status: DC | PRN

## 2015-12-12 NOTE — Progress Notes (Signed)
   Subjective:   Patient ID: Toni Chan, female    DOB: [birth year], 50 y.o.   MRN: [id number]  Patient presents for Same Day Appointment  Chief Complaint  Patient presents with  . Influenza    HPI: # UPPER RESPIRATORY INFECTION Onset: Sunday mid-afternoon   Course: trying to get better; sometimes can feel well Better with: nothing  Meds tried: Advil cold/sinus, chlortab  Sick contacts: Found out Saturday that 3 family members with the flu   Not sleeping well; waking up at night vomiting started yesterday morning just once; most likely secondary to mucous which made her gag Checked temp after and was 101.25F States all this is making her anxiety worse Appetite is worse; but staying hydrated Received flu shot this season  Fever: yes   Headache/face pain: yes  Sneezing: no  Rhinorrhea: no Cough: no Headache: yes  Muscle aches: yes  Severe fatigue: yes  Dyspnea: no  Rash: no   Review of Systems   See HPI for ROS.   Smoking status noted - smoker  Past medical history, surgical, family, and social history reviewed and updated in the EMR as appropriate.  Objective:  BP 128/80 mmHg  Pulse 90  Temp(Src) 98.2 F (36.8 C) (Oral)  Wt 183 lb (83.008 kg)  SpO2 98% Vitals and nursing note reviewed  Physical Exam  Constitutional: She is well-developed, well-nourished, and in no distress.  HENT:  Mouth/Throat: Oropharynx is clear and moist.  Eyes: Conjunctivae and EOM are normal.  Neck: Normal range of motion. Neck supple.  Cardiovascular: Normal rate and regular rhythm.   Pulmonary/Chest: Effort normal and breath sounds normal.  Lymphadenopathy:    She has no cervical adenopathy.    Assessment & Plan:  1. Flu-like symptoms Possible exposure to flu with sick contacts. However appears patient is recovering from illness. Beyond time frame for Tamiflu. Symptomatic conservative management. Rx for cough medicine sent. Return precautions discussed. Handout given.     Meds ordered this encounter  Medications  . dextromethorphan-guaiFENesin (MUCINEX DM) 30-600 MG 12hr tablet    Sig: Take 1 tablet by mouth 2 (two) times daily as needed for cough.    Dispense:  30 tablet    Refill:  0    Luiz Blare, DO 12/12/2015, 1:54 PM PGY-2, [place]

## 2015-12-12 NOTE — Patient Instructions (Signed)
Does not appear that you have the flu now but not saying you are not recovering from it. You were exposed but past the treatment window Continue at home treatments Cough medicine sent to pharmacy  Influenza, Adult Influenza (flu) is an infection in the mouth, nose, and throat (respiratory tract) caused by a virus. The flu can make you feel very ill. Influenza spreads easily from person to person (contagious).  HOME CARE   Only take medicines as told by your doctor.  Use a cool mist humidifier to make breathing easier.  Get plenty of rest until your fever goes away. This usually takes 3 to 4 days.  Drink enough fluids to keep your pee (urine) clear or pale yellow.  Cover your mouth and nose when you cough or sneeze.  Wash your hands well to avoid spreading the flu.  Stay home from work or school until your fever has been gone for at least 1 full day.  Get a flu shot every year. GET HELP RIGHT AWAY IF:   You have trouble breathing or feel short of breath.  Your skin or nails turn blue.  You have severe neck pain or stiffness.  You have a severe headache, facial pain, or earache.  Your fever gets worse or keeps coming back.  You feel sick to your stomach (nauseous), throw up (vomit), or have watery poop (diarrhea).  You have chest pain.  You have a deep cough that gets worse, or you cough up more thick spit (mucus). MAKE SURE YOU:   Understand these instructions.  Will watch your condition.  Will get help right away if you are not doing well or get worse.   This information is not intended to replace advice given to you by your health care provider. Make sure you discuss any questions you have with your health care provider.   Document Released: 06/23/2008 Document Revised: 10/05/2014 Document Reviewed: 12/14/2011 Elsevier Interactive Patient Education [employer].

## 2015-12-27 ENCOUNTER — Telehealth: Payer: Self-pay | Admitting: Family Medicine

## 2015-12-27 ENCOUNTER — Ambulatory Visit (INDEPENDENT_AMBULATORY_CARE_PROVIDER_SITE_OTHER): Payer: Medicaid Other | Admitting: Family Medicine

## 2015-12-27 VITALS — BP 123/80 | HR 86 | Temp 98.5°F | Ht 63.0 in | Wt 178.0 lb

## 2015-12-27 DIAGNOSIS — Z0000 Encounter for general adult medical examination without abnormal findings: Secondary | ICD-10-CM

## 2015-12-27 DIAGNOSIS — E7800 Pure hypercholesterolemia, unspecified: Secondary | ICD-10-CM | POA: Diagnosis not present

## 2015-12-27 DIAGNOSIS — G8929 Other chronic pain: Secondary | ICD-10-CM | POA: Insufficient documentation

## 2015-12-27 DIAGNOSIS — Z23 Encounter for immunization: Secondary | ICD-10-CM

## 2015-12-27 DIAGNOSIS — R109 Unspecified abdominal pain: Secondary | ICD-10-CM

## 2015-12-27 DIAGNOSIS — J302 Other seasonal allergic rhinitis: Secondary | ICD-10-CM

## 2015-12-27 DIAGNOSIS — M5136 Other intervertebral disc degeneration, lumbar region: Secondary | ICD-10-CM

## 2015-12-27 DIAGNOSIS — Z720 Tobacco use: Secondary | ICD-10-CM | POA: Insufficient documentation

## 2015-12-27 MED ORDER — FLUTICASONE PROPIONATE 50 MCG/ACT NA SUSP: 2.0000 | Freq: Every day | NASAL | Status: DC

## 2015-12-27 MED ORDER — PREGABALIN 75 MG PO CAPS: 75.0000 mg | ORAL_CAPSULE | Freq: Two times a day (BID) | ORAL | Status: DC

## 2015-12-27 MED ORDER — HYDROCODONE-ACETAMINOPHEN 10-325 MG PO TABS: 1.0000 | ORAL_TABLET | Freq: Four times a day (QID) | ORAL | Status: DC | PRN

## 2015-12-27 NOTE — Assessment & Plan Note (Signed)
Due for tetanus - provided today Up to date on Influenza Not a candidate for Mammogram/Colonoscopy/Dexa/Pap at this time Encouraged exercise as tolerated.  Due for lipid panel (ordered today).

## 2015-12-27 NOTE — Telephone Encounter (Signed)
Pt would like Dr. Ree Kida to know that Medicaid do pay for Lyrica.

## 2015-12-27 NOTE — Assessment & Plan Note (Signed)
Currently symptomatic off Flonase. Refill provided.

## 2015-12-27 NOTE — Patient Instructions (Signed)
It was nice to see you today.  Check cholesterol. Dr. Ree Kida will call you with the results.  Send for records of Colonoscopy and EGD ([place]). Please call the office when you have the name of the physician that performed the procedure.   Given Tetanus vaccine.   Repeat Colonoscopy at 52 Repeat Mammogram at 52 No need for any more pap smears. Flu shot in September 2017

## 2015-12-27 NOTE — Telephone Encounter (Signed)
Phone note viewed.

## 2015-12-27 NOTE — Assessment & Plan Note (Signed)
Multifactorial from GERD/Endometriosis/Constipation. Symptoms currently manageable.  -send of records of previous EGD and Colonoscopy -continue Omeprazole and Senna.

## 2015-12-27 NOTE — Progress Notes (Signed)
50 y.o. year old female presents for well woman/preventative visit and annual GYN examination.  Acute Concerns: 1. Allergies/sinuses - would like refill of Flonase as has been having increased congestion/runny nose over past few weeks 2. Chronic back pain - requests refill of Norco, would like to change Gabapentin to Lyrica as she thinks her pain was better controlled on this medication.  3. Chronic Abdominal Pain/Hx. Endometriosis - hysterectomy in 1992, Had previous EGD and Colonoscopy (uncertain date). Reported as normal. Regular BM with Senna.   Exercise: No regular exercise due to chronic back pain.   Sexual/Birth History: Not currently sexually active   Birth Control: Previous Hysterectomy in 1992 (no hot flashes, no vaginal symptoms), had previously been on tried on estrogen replacement but stopped when symptoms improved.   POA/Living Will: Son is her POA Doctor, general practice)  Social:  Social History   Social History  . Marital Status: Single    Spouse Name: N/A  . Number of Children: N/A  . Years of Education: N/A   Social History Main Topics  . Smoking status: Current Every Day Smoker -- 0.50 packs/day for 3 years    Types: Cigarettes  . Smokeless tobacco: Never Used  . Alcohol Use: No  . Drug Use: No  . Sexual Activity: Not on file   Other Topics Concern  . Not on file   Social History Narrative   Grew up in [place]. Recently moved back to area from [facility] (10/2014). Has 2 adult children      11/2014   Does not exercise regularly   Former smoker (quit 2009)   Denies recreational drug use   Denies alcohol use   Owns car    Immunization: Immunization History  Administered Date(s) Administered  . Influenza,inj,Quad PF,36+ Mos 08/08/2015  Due for Tetanus  Cancer Screening:  Pap Smear: Complete Hysterectomy (1992)  Mammogram: Mammogram 2013 (negative), no breast issues (no nipple discharge, no lumps); No FM of breast cancer.   Colonoscopy:  Completed ?2014 ([place])  Dexa: Not a candidate    Physical Exam: VITALS: Reviewed GEN: Pleasant female, NAD HEENT: Normocephalic, PERRL, EOMI, no scleral icterus, bilateral TM pearly grey, nasal septum midline, MMM, uvula midline, no anterior or posterior lymphadenopathy, no thyromegaly CARDIAC:RRR, S1 and S2 present, no murmur, no heaves/thrills RESP: CTAB, normal effort BREAST:Deferred as no current breast issues. ABD: soft, mild bilateral lower quadrant tenderness, normal bowel sounds GU/GYN:Deferred as previous hysterectomy and no vaginal symtpoms EXT: No edema, 2+ radial and DP pulses SKIN: no rash  ASSESSMENT & PLAN: 50 y.o. female presents for annual well woman/preventative exam. Please see problem specific assessment and plan.   Preventative health care Due for tetanus - provided today Up to date on Influenza Not a candidate for Mammogram/Colonoscopy/Dexa/Pap at this time Encouraged exercise as tolerated.  Due for lipid panel (ordered today).  Lumbar degenerative disc disease Refill of Norco provided (one month supply) Change Gabapentin to Lyrica 75 mg BID per patient request.   Hypercholesteremia Repeat Lipid Profile today.   Allergic rhinitis Currently symptomatic off Flonase. Refill provided.   Chronic abdominal pain Multifactorial from GERD/Endometriosis/Constipation. Symptoms currently manageable.  -send of records of previous EGD and Colonoscopy -continue Omeprazole and Senna.   Tobacco abuse Smokes 1/2 PPD. Not interested in cessation at this time. -monitor at subsequent visits.

## 2015-12-27 NOTE — Assessment & Plan Note (Signed)
Repeat Lipid Profile today.

## 2015-12-27 NOTE — Assessment & Plan Note (Signed)
Refill of Norco provided (one month supply) Change Gabapentin to Lyrica 75 mg BID per patient request.

## 2015-12-27 NOTE — Assessment & Plan Note (Signed)
Smokes 1/2 PPD. Not interested in cessation at this time. -monitor at subsequent visits.

## 2015-12-27 NOTE — Addendum Note (Signed)
Addended by: Junious Dresser on: 12/27/2015 03:06 PM   Modules accepted: Orders

## 2015-12-28 LAB — LIPID PANEL
Cholesterol: 259 mg/dL — ABNORMAL HIGH (ref 125–200)
HDL: 34 mg/dL — ABNORMAL LOW (ref >=46)
LDL Cholesterol: 148 mg/dL — ABNORMAL HIGH (ref <130)
Total CHOL/HDL Ratio: 7.6 Ratio — ABNORMAL HIGH (ref <=5.0)
Triglycerides: 387 mg/dL — ABNORMAL HIGH (ref <150)
VLDL: 77 mg/dL — ABNORMAL HIGH (ref <30)

## 2015-12-30 ENCOUNTER — Encounter: Payer: Self-pay | Admitting: Family Medicine

## 2015-12-30 DIAGNOSIS — E7800 Pure hypercholesterolemia, unspecified: Secondary | ICD-10-CM

## 2015-12-30 MED ORDER — ATORVASTATIN CALCIUM 40 MG PO TABS: 40.0000 mg | ORAL_TABLET | Freq: Every day | ORAL | Status: DC

## 2016-01-02 DIAGNOSIS — S129XXS Fracture of neck, unspecified, sequela: Secondary | ICD-10-CM | POA: Diagnosis not present

## 2016-01-02 DIAGNOSIS — M5030 Other cervical disc degeneration, unspecified cervical region: Secondary | ICD-10-CM | POA: Diagnosis not present

## 2016-01-02 DIAGNOSIS — M5412 Radiculopathy, cervical region: Secondary | ICD-10-CM | POA: Diagnosis not present

## 2016-01-03 ENCOUNTER — Telehealth: Payer: Self-pay | Admitting: *Deleted

## 2016-01-03 ENCOUNTER — Other Ambulatory Visit: Payer: Self-pay | Admitting: *Deleted

## 2016-01-03 NOTE — Telephone Encounter (Signed)
Error

## 2016-01-03 NOTE — Telephone Encounter (Signed)
Patient calling requesting to speak only with Dr. Ree Kida regarding her allergies.

## 2016-01-03 NOTE — Telephone Encounter (Signed)
Attempted to return patient call. Phone not in service.   RN staff - please call patient pharmacy and get updated phone number. Thanks.

## 2016-01-04 DIAGNOSIS — Z79899 Other long term (current) drug therapy: Secondary | ICD-10-CM | POA: Diagnosis not present

## 2016-01-04 DIAGNOSIS — F17200 Nicotine dependence, unspecified, uncomplicated: Secondary | ICD-10-CM | POA: Diagnosis not present

## 2016-01-04 DIAGNOSIS — J302 Other seasonal allergic rhinitis: Secondary | ICD-10-CM | POA: Diagnosis not present

## 2016-01-06 MED ORDER — CETIRIZINE HCL 10 MG PO TABS: 10.0000 mg | ORAL_TABLET | Freq: Every day | ORAL | Status: DC

## 2016-01-06 MED ORDER — FLUTICASONE PROPIONATE 50 MCG/ACT NA SUSP: 2.0000 | Freq: Every day | NASAL | Status: DC

## 2016-01-06 MED ORDER — CYCLOBENZAPRINE HCL 10 MG PO TABS: ORAL_TABLET | ORAL | Status: DC

## 2016-01-06 NOTE — Addendum Note (Signed)
Addended by: Lupita Dawn on: 01/06/2016 02:25 PM   Modules accepted: Orders

## 2016-01-06 NOTE — Telephone Encounter (Signed)
Contacted pharmacy.  Patient phone [phone]

## 2016-01-06 NOTE — Telephone Encounter (Signed)
Patient reports increased allgies/right ear pan. Seen in ED and diagnosed with serous effusion .Started on Zyrtec. Has not picked up Flonase as it was sent to [facility] (now would like to change pharmacy back to [facility]). Refill for flonase sent to [facility].

## 2016-01-10 ENCOUNTER — Other Ambulatory Visit: Payer: Self-pay | Admitting: Family Medicine

## 2016-01-13 ENCOUNTER — Encounter (HOSPITAL_COMMUNITY): Payer: Self-pay

## 2016-01-24 ENCOUNTER — Ambulatory Visit (INDEPENDENT_AMBULATORY_CARE_PROVIDER_SITE_OTHER): Payer: Medicare Other | Admitting: Family Medicine

## 2016-01-24 ENCOUNTER — Encounter: Payer: Self-pay | Admitting: Family Medicine

## 2016-01-24 VITALS — BP 107/58 | HR 81 | Temp 97.6°F | Ht 63.0 in | Wt 184.5 lb

## 2016-01-24 DIAGNOSIS — Z720 Tobacco use: Secondary | ICD-10-CM

## 2016-01-24 DIAGNOSIS — M5136 Other intervertebral disc degeneration, lumbar region: Secondary | ICD-10-CM | POA: Diagnosis not present

## 2016-01-24 DIAGNOSIS — J302 Other seasonal allergic rhinitis: Secondary | ICD-10-CM | POA: Diagnosis not present

## 2016-01-24 MED ORDER — HYDROCODONE-ACETAMINOPHEN 10-325 MG PO TABS: 1.0000 | ORAL_TABLET | Freq: Four times a day (QID) | ORAL | Status: DC | PRN

## 2016-01-24 MED ORDER — NICOTINE 21 MG/24HR TD PT24: 21.0000 mg | MEDICATED_PATCH | Freq: Every day | TRANSDERMAL | Status: DC

## 2016-01-24 MED ORDER — PREGABALIN 75 MG PO CAPS: 75.0000 mg | ORAL_CAPSULE | Freq: Three times a day (TID) | ORAL | Status: DC

## 2016-01-24 NOTE — Assessment & Plan Note (Signed)
Patient requests assistance with smoking cessation. -started 21 mcg nicotine patch -return in 2-3 weeks for follow up

## 2016-01-24 NOTE — Patient Instructions (Addendum)
It was nice to see you today.   Smoking - congratulations on deciding to quit. I have prescribed a nicotine patch.  Nerve pain - increase Lyrica to 75 mg TID
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
## 2016-01-24 NOTE — Assessment & Plan Note (Signed)
Patient reports uncontrolled neuropathic pain of RLE. -increase Lyrica to 75 mg TID -refill of Norco provided

## 2016-01-24 NOTE — Assessment & Plan Note (Signed)
Patient reports that symptoms are controlled on Flonase and Zyrtec. -continue current regimen

## 2016-01-24 NOTE — Progress Notes (Signed)
   Subjective:    Patient ID: Toni Chan, female    DOB: Aug 23, 1966, 50 y.o.   MRN: [id number]  HPI 50 y/o female presents for routine follow up.  Tobacco Cessation - interested in smoking cessation, currently smoking 1 PPD, smoking for close to 30 years, has quit a few times over the years, had success with nicotine patch in the past  Chronic back pain with sciatica - continues to have burning pain in lower right leg ankle, taking Lyrica 75 mg BID and Norco QID, no improvement with trial of Tramadol  Allergies - controlled with Zyrtec and Flonase   Review of Systems  Constitutional: Negative for fever, chills and fatigue.  Respiratory: Negative for cough and shortness of breath.   Cardiovascular: Negative for chest pain.  Gastrointestinal: Negative for nausea, vomiting and diarrhea.       Objective:   Physical Exam BP 107/58 mmHg  Pulse 81  Temp(Src) 97.6 F (36.4 C) (Oral)  Ht 5\' 3"  (1.6 m)  Wt 184 lb 8 oz (83.689 kg)  BMI 32.69 kg/m2 Gen: pleasant female, NAD HEENT: normocephalic, PERRL, EOMI, no scleral icterus, nasal septum midline, nasal turbinates inflamed, MMM, uvula midline, neck supple, no cervical adenopathy Cardiac: RRR, S1 and S2 present, no murmur Resp: CTAB, normal effort      Assessment & Plan:  Tobacco abuse Patient requests assistance with smoking cessation. -started 21 mcg nicotine patch -return in 2-3 weeks for follow up  Lumbar degenerative disc disease Patient reports uncontrolled neuropathic pain of RLE. -increase Lyrica to 75 mg TID -refill of Norco provided  Allergic rhinitis Patient reports that symptoms are controlled on Flonase and Zyrtec. -continue current regimen

## 2016-02-03 ENCOUNTER — Telehealth: Payer: Self-pay | Admitting: Family Medicine

## 2016-02-04 NOTE — Telephone Encounter (Signed)
Pt is calling because the doctor sent in Zyrtec on 4/10 with 11 refills to her pharmacy but her pharmacy said that they never received this. Can we call this in again. jw

## 2016-02-05 MED ORDER — CETIRIZINE HCL 10 MG PO TABS: 10.0000 mg | ORAL_TABLET | Freq: Every day | ORAL | Status: DC

## 2016-02-05 NOTE — Telephone Encounter (Signed)
Rx was approved in April but stated no print.  Rx resent electronically to pharmacy.  Derl Barrow, RN

## 2016-02-07 ENCOUNTER — Ambulatory Visit: Payer: Self-pay | Admitting: Family Medicine

## 2016-02-21 ENCOUNTER — Ambulatory Visit (INDEPENDENT_AMBULATORY_CARE_PROVIDER_SITE_OTHER): Payer: Medicare Other | Admitting: Family Medicine

## 2016-02-21 ENCOUNTER — Encounter: Payer: Self-pay | Admitting: Family Medicine

## 2016-02-21 VITALS — BP 133/85 | HR 83 | Temp 98.2°F | Ht 63.0 in | Wt 184.6 lb

## 2016-02-21 DIAGNOSIS — F418 Other specified anxiety disorders: Secondary | ICD-10-CM

## 2016-02-21 DIAGNOSIS — F419 Anxiety disorder, unspecified: Secondary | ICD-10-CM

## 2016-02-21 DIAGNOSIS — M5030 Other cervical disc degeneration, unspecified cervical region: Secondary | ICD-10-CM

## 2016-02-21 DIAGNOSIS — R21 Rash and other nonspecific skin eruption: Secondary | ICD-10-CM

## 2016-02-21 DIAGNOSIS — F329 Major depressive disorder, single episode, unspecified: Secondary | ICD-10-CM

## 2016-02-21 DIAGNOSIS — F32A Depression, unspecified: Secondary | ICD-10-CM

## 2016-02-21 DIAGNOSIS — Z720 Tobacco use: Secondary | ICD-10-CM | POA: Diagnosis not present

## 2016-02-21 DIAGNOSIS — M5136 Other intervertebral disc degeneration, lumbar region: Secondary | ICD-10-CM | POA: Diagnosis not present

## 2016-02-21 MED ORDER — CETIRIZINE HCL 10 MG PO TABS: 10.0000 mg | ORAL_TABLET | Freq: Every day | ORAL | Status: DC

## 2016-02-21 MED ORDER — HYDROCODONE-ACETAMINOPHEN 10-325 MG PO TABS: 1.0000 | ORAL_TABLET | Freq: Four times a day (QID) | ORAL | Status: DC | PRN

## 2016-02-21 MED ORDER — CYCLOBENZAPRINE HCL 10 MG PO TABS: ORAL_TABLET | ORAL | Status: DC

## 2016-02-21 NOTE — Assessment & Plan Note (Signed)
Blanchable macules with occasional scale. 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 Patient otherwise well. -will monitor and if worsens/does not resolve will consider punch biopsy.

## 2016-02-21 NOTE — Progress Notes (Signed)
Subjective:     Patient ID: Toni Chan, female   DOB: 06-06-1966, 50 y.o.   MRN: [id number]  HPI  50 y/o female presents for routine follow up.   Smoking Cessation: Has not started nicotine patch due to recent stress at home, started first patch today, still smoking 1/2 PPD.   Chronic Low Back Pain: Having pain in both legs but worse in right leg. She says it has migrated to her left leg. Pain is described as burning in left but burning and sharp pain in right leg. Says her legs feel like they are on fire. Taking Lyrica 3 times per day as prescribed. Has pain in both legs.  Trying to walk and stay active but pain is limited her ability to walk for extended periods of time. Says prolonged sitting causes extreme discomfort. She does have one episode of urinary incontinence during sleep a few weeks ago, no further symptoms, no dysuria.   Depression/Anxiety:  much more improved before- has lost interest in things she enjoyed, but has not felt like hurting herself. Taking cymbalta 60 mg daily and Buspar 3.25 mg BID (prescirbed 7.5 mg BID).   Rash: having breakout on legs. Does not itch or bother her but she is concerned because its been there for about a month. No pruritis. Did have GI symptoms for 4-5 days at same time as rash started. No new detergent, no new medications, no new soaps. Son also does not have symptoms.    Review of Systems  Constitutional: Negative for fever, chills and fatigue.  Respiratory: Negative for cough and shortness of breath.   Cardiovascular: Negative for chest pain.  Gastrointestinal: Negative for nausea, vomiting and diarrhea.       Objective:   Physical Exam BP 133/85 mmHg  Pulse 83  Temp(Src) 98.2 F (36.8 C) (Oral)  Ht 5\' 3"  (1.6 m)  Wt 184 lb 9.6 oz (83.734 kg)  BMI 32.71 kg/m2 Gen: pleasant female, NAD Cardiac: RRR, S1 and S2 present, no murmur Resp: CTAB, normal effort Skin: blanchable macular rash of bilateral legs, some crusting over a few of  the lesions, attempted to take picture but was unable to connect to server to upload picture.       Assessment:     50 y/o female presents for routine follow up.      Plan:     Lumbar degenerative disc disease Stable. On episode of incontinence recently but no further symptoms. -continue Norco/Flexeril as needed. Refills provided.   Anxiety and depression Anxiety slightly worsened due to recent stress related to son. -increase Buspar to 7.5 mg daily -continue Cymbalta 60 mg daily  Tobacco abuse Continues to smoke 1/2 PPD. Started nicotine patch today. -follow up at next visit  Rash and nonspecific skin eruption Blanchable macules with occasional scale. 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 Patient otherwise well. -will monitor and if worsens/does not resolve will consider punch biopsy.

## 2016-02-21 NOTE — Assessment & Plan Note (Signed)
Continues to smoke 1/2 PPD. Started nicotine patch today. -follow up at next visit

## 2016-02-21 NOTE — Assessment & Plan Note (Deleted)
Stable. On episode of incontinence recently but no further symptoms. -continue Norco/Flexeril as needed. Refills provided.

## 2016-02-21 NOTE — Assessment & Plan Note (Signed)
Stable. On episode of incontinence recently but no further symptoms. -continue Norco/Flexeril as needed. Refills provided.

## 2016-02-21 NOTE — Patient Instructions (Addendum)
It was nice to see you today.  Buspar - start to take 7.5 mg (one full tablet twice a day). Continue Celexa.  Back Pain - refills on Norco and Flexeril provided  Rash - I am not certain what is causing your rash, I will do some reading and get back to you. If is worsens please return to the office.

## 2016-02-21 NOTE — Assessment & Plan Note (Signed)
Anxiety slightly worsened due to recent stress related to son. -increase Buspar to 7.5 mg daily -continue Cymbalta 60 mg daily

## 2016-02-28 ENCOUNTER — Encounter: Payer: Self-pay | Admitting: Family Medicine

## 2016-03-03 ENCOUNTER — Encounter: Payer: Self-pay | Admitting: Family Medicine

## 2016-03-03 ENCOUNTER — Other Ambulatory Visit: Payer: Self-pay | Admitting: Family Medicine

## 2016-03-03 MED ORDER — TRIAMCINOLONE ACETONIDE 0.1 % EX CREA: 1.0000 "application " | TOPICAL_CREAM | Freq: Two times a day (BID) | CUTANEOUS | Status: DC

## 2016-03-09 ENCOUNTER — Other Ambulatory Visit: Payer: Self-pay | Admitting: Family Medicine

## 2016-03-10 ENCOUNTER — Telehealth: Payer: Self-pay | Admitting: Family Medicine

## 2016-03-10 NOTE — Telephone Encounter (Signed)
Called patient to confirm that dose of Buspar is 7.5 mg BID. Patient confirmed dose. Sent in refill.   Patient noted 3 day history of crampy abdominal pain, associated decreased appetite and diarrhea. No fevers. Patient thinks perhaps related to stress/anxiety. Slightly improved today (less pain, decreased diarrhea, tolerated small amount of food). Plans to keep journal of pain/symtpoms. Will discuss in more detail at next visit. We discussed reasons to return sooner.

## 2016-03-20 ENCOUNTER — Encounter: Payer: Self-pay | Admitting: Family Medicine

## 2016-03-20 ENCOUNTER — Ambulatory Visit (INDEPENDENT_AMBULATORY_CARE_PROVIDER_SITE_OTHER): Payer: Medicare Other | Admitting: Family Medicine

## 2016-03-20 DIAGNOSIS — L409 Psoriasis, unspecified: Secondary | ICD-10-CM | POA: Diagnosis not present

## 2016-03-20 DIAGNOSIS — R21 Rash and other nonspecific skin eruption: Secondary | ICD-10-CM

## 2016-03-20 MED ORDER — HYDROCODONE-ACETAMINOPHEN 10-325 MG PO TABS: 1.0000 | ORAL_TABLET | Freq: Four times a day (QID) | ORAL | Status: DC | PRN

## 2016-03-20 NOTE — Patient Instructions (Signed)
Dr. Ree Kida will call you with your lab and biopsy results next week.  Continue the Triamcinolone cream for the time being.

## 2016-03-20 NOTE — Assessment & Plan Note (Signed)
Rash minimally responded to topical Triamcinolone. -will collect punch biopsy to evaluate etiology of rash. -check ANA to rule out rheumatologic cause.
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
## 2016-03-23 ENCOUNTER — Encounter: Payer: Self-pay | Admitting: Family Medicine

## 2016-03-23 LAB — ANA: ANA: NEGATIVE

## 2016-03-25 ENCOUNTER — Telehealth: Payer: Self-pay | Admitting: Family Medicine

## 2016-03-25 NOTE — Telephone Encounter (Signed)
Attempted to contact with no answer.

## 2016-03-25 NOTE — Telephone Encounter (Signed)
Attempted to contact about pathology results, left message that I would call back tomorrow.

## 2016-03-27 MED ORDER — TRIAMCINOLONE ACETONIDE 0.5 % EX OINT: 1.0000 "application " | TOPICAL_OINTMENT | Freq: Two times a day (BID) | CUTANEOUS | Status: DC

## 2016-03-27 NOTE — Telephone Encounter (Signed)
Called patient to discuss biopsy results. Consistent with psoriasis. Increased Triamcinolone cream to 0.5% and encouraged her to use T-gel shampoo. She also reports increased stress/anxiety. No changes in meds but encouraged her to follow up in office if symptoms persist.

## 2016-04-01 ENCOUNTER — Other Ambulatory Visit: Payer: Self-pay | Admitting: *Deleted

## 2016-04-02 MED ORDER — PREGABALIN 75 MG PO CAPS: 75.0000 mg | ORAL_CAPSULE | Freq: Three times a day (TID) | ORAL | Status: DC

## 2016-04-02 NOTE — Telephone Encounter (Signed)
Called rx into pharmacy. Deseree Kennon Holter, CMA

## 2016-04-02 NOTE — Telephone Encounter (Signed)
RN staff - please call in Lyrica 75 mg TID, dispense #90, refill #5, thanks

## 2016-04-17 ENCOUNTER — Ambulatory Visit (INDEPENDENT_AMBULATORY_CARE_PROVIDER_SITE_OTHER): Payer: Medicare Other | Admitting: Family Medicine

## 2016-04-17 ENCOUNTER — Encounter: Payer: Self-pay | Admitting: Family Medicine

## 2016-04-17 ENCOUNTER — Other Ambulatory Visit: Payer: Self-pay | Admitting: Family Medicine

## 2016-04-17 VITALS — BP 130/77 | HR 86 | Temp 98.3°F | Ht 63.0 in | Wt 184.2 lb

## 2016-04-17 DIAGNOSIS — Z720 Tobacco use: Secondary | ICD-10-CM | POA: Diagnosis not present

## 2016-04-17 DIAGNOSIS — R32 Unspecified urinary incontinence: Secondary | ICD-10-CM | POA: Insufficient documentation

## 2016-04-17 DIAGNOSIS — M5136 Other intervertebral disc degeneration, lumbar region: Secondary | ICD-10-CM

## 2016-04-17 DIAGNOSIS — R21 Rash and other nonspecific skin eruption: Secondary | ICD-10-CM

## 2016-04-17 DIAGNOSIS — G8929 Other chronic pain: Secondary | ICD-10-CM

## 2016-04-17 DIAGNOSIS — Z7189 Other specified counseling: Secondary | ICD-10-CM | POA: Diagnosis present

## 2016-04-17 MED ORDER — CYCLOBENZAPRINE HCL 10 MG PO TABS: ORAL_TABLET | ORAL | Status: DC

## 2016-04-17 MED ORDER — HYDROCODONE-ACETAMINOPHEN 10-325 MG PO TABS: 1.0000 | ORAL_TABLET | Freq: Four times a day (QID) | ORAL | Status: DC | PRN

## 2016-04-17 NOTE — Assessment & Plan Note (Signed)
Pain and function improved with Narcotics -one month refill of Norco provided -UDS completed today

## 2016-04-17 NOTE — Assessment & Plan Note (Signed)
Patient smoking less than pack per day. Stopped nicotine patches. Wishes to stop cold-turkey. -monitor at subsequent visits.

## 2016-04-17 NOTE — Assessment & Plan Note (Addendum)
Patient having worsening radicular symptoms of LLE. No bladder/bowel incontinence, no saddle anesthesia. Some urine leakage (very intermittently). -patient encouraged to follow up with Ortho Dr. Lorin Mercy -refilled Norco and Flexeril -continue Cymbalta and Lyrica
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
## 2016-04-17 NOTE — Assessment & Plan Note (Signed)
Intermittent leaking of urine (mostly during night when sleeping). Denies stress or urge symptoms. Symptoms likely related to previous child birth and hysterectomy.   -patient provided information on kegal exercises -if no improvement will need further workup.

## 2016-04-17 NOTE — Patient Instructions (Addendum)
It was nice to see you today.  I have given you refills of Norco and Flexeril. Please call Dr. Lorin Mercy to check on your back.  Urine leaking. Please try the following exercises.  Web link for Kegel exercises: [email or web address]

## 2016-04-17 NOTE — Assessment & Plan Note (Signed)
Resolved with steroid cream.

## 2016-04-20 ENCOUNTER — Telehealth: Payer: Self-pay | Admitting: Psychology

## 2016-04-20 NOTE — Telephone Encounter (Signed)
[facility] contacted patient per Dr. Nedra Hai suggestion and scheduled [facility] appointment for Friday July 28th at 2:30PM.

## 2016-04-22 LAB — PAIN MGMT, PROFILE 5 W/O MEDMATCH U
Amphetamines: NEGATIVE ng/mL (ref <500)
BARBITURATES: NEGATIVE ng/mL (ref <300)
BENZODIAZEPINES: NEGATIVE ng/mL (ref <100)
CODEINE: NEGATIVE ng/mL (ref <50)
CREATININE: 30.8 mg/dL (ref >=20.0)
Cocaine Metabolite: NEGATIVE ng/mL (ref <150)
HYDROMORPHONE: NEGATIVE ng/mL (ref <50)
Hydrocodone: NEGATIVE ng/mL (ref <50)
MARIJUANA METABOLITE: NEGATIVE ng/mL (ref <20)
Methadone Metabolite: NEGATIVE ng/mL (ref <100)
Morphine: NEGATIVE ng/mL (ref <50)
Norhydrocodone: NEGATIVE ng/mL (ref <50)
OPIATES: NEGATIVE ng/mL (ref <100)
OXIDANT: NEGATIVE ug/mL (ref <200)
Oxycodone: NEGATIVE ng/mL (ref <100)
PH: 6.82 (ref 4.5–9.0)

## 2016-04-23 ENCOUNTER — Other Ambulatory Visit: Payer: Self-pay | Admitting: Family Medicine

## 2016-04-23 ENCOUNTER — Telehealth: Payer: Self-pay | Admitting: *Deleted

## 2016-04-23 NOTE — Telephone Encounter (Signed)
I am the physician covering for Dr. Ree Kida.  Iburpofen as needed every 6 - 8 hours plus saline spray would be the best things for her.  If she is having a cough OTC robitussin can help.  Unfortunately there's not much else besides time that will help her.  If she doesn't improve in the next several days or worsens, she should come in to be seen.

## 2016-04-23 NOTE — Telephone Encounter (Signed)
Patient stating that she has been having some chest congestion and bad headaches and her allergy medications havent been helping like they normally do. Patient requesting refill on her ibuprofen and wants to know if there is anything else MD can prescribe to help.

## 2016-04-24 ENCOUNTER — Ambulatory Visit: Payer: Self-pay

## 2016-04-24 ENCOUNTER — Telehealth: Payer: Self-pay | Admitting: Psychology

## 2016-04-24 NOTE — Telephone Encounter (Signed)
[facility] called patient to follow up on cancelled appointment. Patient stated she is under the weather and will call to reschedule at a later date.

## 2016-04-25 ENCOUNTER — Emergency Department (HOSPITAL_COMMUNITY): Payer: Medicare Other

## 2016-04-25 ENCOUNTER — Encounter (HOSPITAL_COMMUNITY): Payer: Self-pay | Admitting: Emergency Medicine

## 2016-04-25 ENCOUNTER — Telehealth: Payer: Self-pay | Admitting: Obstetrics and Gynecology

## 2016-04-25 ENCOUNTER — Emergency Department (HOSPITAL_COMMUNITY)
Admission: EM | Admit: 2016-04-25 | Discharge: 2016-04-26 | Disposition: A | Discharge status: Home / Self Care | Payer: Medicare Other | Attending: Emergency Medicine | Admitting: Emergency Medicine

## 2016-04-25 DIAGNOSIS — Z79899 Other long term (current) drug therapy: Secondary | ICD-10-CM | POA: Insufficient documentation

## 2016-04-25 DIAGNOSIS — J040 Acute laryngitis: Secondary | ICD-10-CM | POA: Diagnosis not present

## 2016-04-25 DIAGNOSIS — R509 Fever, unspecified: Secondary | ICD-10-CM | POA: Diagnosis not present

## 2016-04-25 DIAGNOSIS — Z87891 Personal history of nicotine dependence: Secondary | ICD-10-CM | POA: Diagnosis not present

## 2016-04-25 DIAGNOSIS — J40 Bronchitis, not specified as acute or chronic: Secondary | ICD-10-CM

## 2016-04-25 DIAGNOSIS — R42 Dizziness and giddiness: Secondary | ICD-10-CM | POA: Diagnosis not present

## 2016-04-25 NOTE — ED Triage Notes (Signed)
Pt reports hoarseness, fever and dizziness that started today.

## 2016-04-25 NOTE — ED Provider Notes (Signed)
[facility] DEPT Provider Note   CSN: [id number] Arrival date & time: 04/25/16  2250  First Provider Contact:   First MD Initiated Contact with Patient 04/25/16 2326        By signing my name below, I, Hansel Feinstein, attest that this documentation has been prepared under the direction and in the presence of Rolland Porter, MD. Electronically Signed: Hansel Feinstein, ED Scribe. 04/25/16. 11:34 PM.    History   Chief Complaint Chief Complaint  Patient presents with  . Dizziness    HPI Toni Chan is a 50 y.o. female who presents to the Emergency Department complaining of intermittent sneezing onset 2 days ago with associated watery eyes. She also complains of productive cough with green phlegm, mild wheezing and voice hoarseness onset yesterday. Pt also states she has been dizzy since she woke up this morning, which is exacerbated with movement and positional changes. Her highest temp today was 99.0. She characterizes her dizziness as feeling like she is off balance, and states she does not feel light-headed or experience any room-spinning. She notes that she did not eat or urinate as much as she normally wound today. Pt reports that she consumed increased amounts of water and Gatorade today. She states she smokes 5 cigarettes per day. Pt is a non-drinker. She is on disability for her back. She denies syncope, nausea, emesis, diarrhea, sore throat.  She has not had sick contacts.  PCP- Lupita Dawn, MD    The history is provided by the patient. No language interpreter was used.    Past Medical History:  Diagnosis Date  . Anxiety   . Arthritis   . Cervical cancer ([place]) 1989   Vaginal hysterectomy and oophorectomy 1993  . Chronic back pain   . Chronic neck pain   . Depression   . Endometriosis   . GERD (gastroesophageal reflux disease)   . Hx MRSA infection 2014   in the area of the scar fr. having coccyx removal  . IBS (irritable bowel syndrome)   . Kidney stones 2014  .  Pilonidal cyst 2015  . Placental abruption   . Pneumonia   . Renal cyst 2014  . Superficial thrombophlebitis     Patient Active Problem List   Diagnosis Date Noted  . Leaking of urine 04/17/2016  . Chronic abdominal pain 12/27/2015  . Tobacco abuse 12/27/2015  . Right shoulder pain 09/06/2015  . Allergic rhinitis 08/08/2015  . Constipation 07/19/2015  . Social anxiety disorder 07/19/2015  . Abdominal pain, left upper quadrant 07/05/2015  . Rash and nonspecific skin eruption 06/13/2015  . DDD (degenerative disc disease), cervical 04/23/2015  . S/P lumbar fusion 02/27/2015  . Encounter for chronic pain management 01/02/2015  . Hypercholesteremia 12/04/2014  . Lumbar degenerative disc disease 12/03/2014  . Preventative health care 12/03/2014  . Endometriosis 12/03/2014  . GERD (gastroesophageal reflux disease) 12/03/2014  . Anxiety and depression 12/03/2014    Past Surgical History:  Procedure Laterality Date  . ABDOMINAL HYSTERECTOMY     laparoscopic  . BACK SURGERY  2013, 2014 [place]  . COCCYX REMOVAL  2014  . COLONOSCOPY    . LUMBAR FUSION  02/27/2015   L4    . TONSILLECTOMY      OB History    Gravida Para Term Preterm AB Living             2   SAB TAB Ectopic Multiple Live Births  Home Medications    Prior to Admission medications   Medication Sig Start Date End Date Taking? Authorizing Provider  atorvastatin (LIPITOR) 40 MG tablet Take 1 tablet (40 mg total) by mouth daily. 12/30/15   Lupita Dawn, MD  azithromycin (ZITHROMAX) 250 MG tablet Take first 2 tablets together, then 1 every day until finished. 04/26/16   Rolland Porter, MD  busPIRone (BUSPAR) 7.5 MG tablet TAKE (1) TABLET TWICE DAILY. 03/10/16   Lupita Dawn, MD  cetirizine (ZYRTEC) 10 MG tablet Take 1 tablet (10 mg total) by mouth daily. 02/21/16   Lupita Dawn, MD  cyclobenzaprine (FLEXERIL) 10 MG tablet TAKE 1 TABLET THREE TIMES DAILY AS NEEDED FOR MUSCLE  SPASM(s). 04/17/16   Lupita Dawn, MD  DULoxetine (CYMBALTA) 60 MG capsule TAKE 1 CAPSULE BY MOUTH ONCE A DAY. 04/17/16   Lupita Dawn, MD  fluticasone (FLONASE) 50 MCG/ACT nasal spray Place 2 sprays into both nostrils daily. 01/06/16   Lupita Dawn, MD  HYDROcodone-acetaminophen (NORCO) 10-325 MG tablet Take 1 tablet by mouth every 6 (six) hours as needed. 04/17/16   Lupita Dawn, MD  ibuprofen (ADVIL,MOTRIN) 800 MG tablet TAKE (1) TABLET EVERY EIGHT HOURS AS NEEDED. 01/13/16   Lupita Dawn, MD  omeprazole (PRILOSEC) 20 MG capsule Take 1 capsule (20 mg total) by mouth daily. Patient taking differently: Take 20 mg by mouth daily as needed (only takes when needed for gas/acid reflux).  12/03/14   Lupita Dawn, MD  polyethylene glycol powder (GLYCOLAX/MIRALAX) powder Take 17 g by mouth daily. 07/29/15   Lupita Dawn, MD  pregabalin (LYRICA) 75 MG capsule Take 1 capsule (75 mg total) by mouth 3 (three) times daily. 04/02/16   Lupita Dawn, MD  senna (SENOKOT) 8.6 MG tablet Take 3 tablets by mouth at bedtime.    Historical Provider, MD  triamcinolone ointment (KENALOG) 0.5 % Apply 1 application topically 2 (two) times daily. 03/27/16   Lupita Dawn, MD    Family History Family History  Problem Relation Age of Onset  . Depression Mother   . Alcohol abuse Father   . Heart disease Father   . Kidney disease Father   . Alcohol abuse Brother   . Heart disease Brother   . Drug abuse Brother     Social History Social History  Substance Use Topics  . Smoking status: Former Smoker    Packs/day: 0.50    Years: 3.00    Types: Cigarettes  . Smokeless tobacco: Never Used  . Alcohol use No  on disability Uses a cane   Allergies   Review of patient's allergies indicates no known allergies.   Review of Systems Review of Systems  HENT: Positive for sneezing. Negative for sore throat.   Eyes: Positive for discharge.  Respiratory: Positive for cough and wheezing.   Gastrointestinal: Negative  for diarrhea, nausea and vomiting.  Neurological: Positive for dizziness. Negative for syncope.  All other systems reviewed and are negative.    Physical Exam Updated Vital Signs BP 134/70 (BP Location: Left Arm)   Pulse 88   Temp 98.4 F (36.9 C) (Oral)   Resp 20   SpO2 98%   Vital signs normal    Physical Exam  Constitutional: She is oriented to person, place, and time. She appears well-developed and well-nourished.  Non-toxic appearance. She does not appear ill. No distress.  HENT:  Head: Normocephalic and atraumatic.  Right Ear: External ear normal.  Left Ear: External  ear normal.  Nose: Nose normal. No mucosal edema or rhinorrhea.  Mouth/Throat: Oropharynx is clear and moist and mucous membranes are normal. No dental abscesses or uvula swelling.  Pt is hoarse.   Eyes: Conjunctivae and EOM are normal. Pupils are equal, round, and reactive to light.  Neck: Normal range of motion and full passive range of motion without pain. Neck supple.  Cardiovascular: Normal rate, regular rhythm and normal heart sounds.  Exam reveals no gallop and no friction rub.   No murmur heard. Pulmonary/Chest: Effort normal and breath sounds normal. No respiratory distress. She has no wheezes. She has no rhonchi. She has no rales. She exhibits no tenderness and no crepitus.  Abdominal: Soft. Normal appearance and bowel sounds are normal. She exhibits no distension. There is no tenderness. There is no rebound and no guarding.  Musculoskeletal: Normal range of motion. She exhibits no edema or tenderness.  Moves all extremities well.   Neurological: She is alert and oriented to person, place, and time. She has normal strength. No cranial nerve deficit.  Skin: Skin is warm, dry and intact. No rash noted. No erythema. No pallor.  Psychiatric: She has a normal mood and affect. Her speech is normal and behavior is normal. Her mood appears not anxious.  Nursing note and vitals reviewed.    ED Treatments  / Results  Labs (all labs ordered are listed, but only abnormal results are displayed) Labs Reviewed - No data to display  EKG  EKG Interpretation None       Radiology Dg Chest 2 View  Result Date: 04/26/2016 CLINICAL DATA:  50 year old female with fever and dizziness EXAM: CHEST  2 VIEW COMPARISON:  Chest radiograph dated 02/20/2015 FINDINGS: The heart size and mediastinal contours are within normal limits. Both lungs are clear. The visualized skeletal structures are unremarkable. IMPRESSION: No active cardiopulmonary disease. Electronically Signed   By: Anner Crete M.D.   On: 04/26/2016 00:06   Procedures Procedures (including critical care time)  DIAGNOSTIC STUDIES: Oxygen Saturation is 98% on RA, normal by my interpretation.      Medications Ordered in ED Medications - No data to display   Initial Impression / Assessment and Plan / ED Course  I have reviewed the triage vital signs and the nursing notes.  Pertinent labs & imaging results that were available during my care of the patient were reviewed by me and considered in my medical decision making (see chart for details).Chest x-ray was done due to low-grade temp and patient smoking history.  Patient was discharged home on a Z-Pak due to her smoking history. We discussed her laryngitis is most likely a viral problem. She should have it rechecked if it lasts more than 2 weeks, then a concern would be for some other underlying pathology. She did not have wheezing in the emergency department and she was not started on inhaler. She has not used inhalers in the past. She can use over-the-counter cough medications and take Motrin or Tylenol for her fever.    Clinical Course    11:32 PM Discussed treatment plan with pt at bedside which includes CXR and pt agreed to plan.  Final Clinical Impressions(s) / ED Diagnoses   Final diagnoses:  Laryngitis  Bronchitis    New Prescriptions New Prescriptions    AZITHROMYCIN (ZITHROMAX) 250 MG TABLET    Take first 2 tablets together, then 1 every day until finished.  OTC mucinex DM  Plan discharge  Rolland Porter, MD, [place]  personally performed the services described in this documentation, which was scribed in my presence. The recorded information has been reviewed and considered.  Rolland Porter, MD, Barbette Or, MD 04/26/16 [phone]

## 2016-04-25 NOTE — Telephone Encounter (Signed)
Family Medicine Emergency Line Telephone Note   Patient calling because she is still feeling unwell. Started felling sick on Thursday and has been worsening. Endorses some dizziness when walking and low grade fevers (29F). From reviewing the chart she had called the office on 7/27 endorsing cold symptoms and conservative measures recommended to try first. Patient did not come into clinic to be evaluated.   Advised patient that if she is worsening and not feeling well she should be seen by doctor. Unfortunately we do not have clinic hours on the weekend so her only options are ED or urgent care. She was expecting to get medicine ie Abx over the phone but this is not good medical care.   Patient voiced understanding and agreed to plan.   Luiz Blare, DO 04/25/2016, 9:44 PM PGY-3, [facility]

## 2016-04-26 MED ORDER — AZITHROMYCIN 250 MG PO TABS: ORAL_TABLET | ORAL | 0 refills | Status: DC

## 2016-04-26 NOTE — Discharge Instructions (Signed)
Drink plenty of fluids. Take the antibiotic until gone. You can take mucinex DM OTC for your cough. Monitor your fever, you can take ibuprofen 600 mg and/or acetaminophen 1000 mg 4 times a day for fever if needed. Try to stop smoking.   Recheck if you are hoarse longer than 2 weeks, you struggle to breathe or seem worse.

## 2016-04-26 NOTE — ED Notes (Signed)
Patient verbalizes understanding of discharge instructions, prescriptions, home care and follow up care. Patient out of department at this time.s

## 2016-04-27 ENCOUNTER — Telehealth: Payer: Self-pay | Admitting: Family Medicine

## 2016-04-27 DIAGNOSIS — B349 Viral infection, unspecified: Secondary | ICD-10-CM

## 2016-04-27 MED ORDER — IBUPROFEN 800 MG PO TABS: 800.0000 mg | ORAL_TABLET | Freq: Three times a day (TID) | ORAL | 0 refills | Status: DC | PRN

## 2016-04-27 NOTE — Telephone Encounter (Signed)
Patient seen in ED, diagnosed with laryngitis/brochitis.

## 2016-04-27 NOTE — Telephone Encounter (Signed)
Pt would like Dr. Ree Kida to give her a call as soon as he could. Pt did not give a reason or anymore information. ep

## 2016-04-28 DIAGNOSIS — B349 Viral infection, unspecified: Secondary | ICD-10-CM | POA: Insufficient documentation

## 2016-04-28 MED ORDER — DM-GUAIFENESIN ER 30-600 MG PO TB12: 1.0000 | ORAL_TABLET | Freq: Two times a day (BID) | ORAL | 0 refills | Status: DC

## 2016-04-28 MED ORDER — BENZONATATE 100 MG PO CAPS: 100.0000 mg | ORAL_CAPSULE | Freq: Three times a day (TID) | ORAL | 0 refills | Status: DC | PRN

## 2016-04-28 NOTE — Telephone Encounter (Signed)
Patient seen in the ED a few nights ago. Diagnosed with bronchitis/laryngitis. Started on Z-pack. Reports sputum production, emesis with coughing and would like something for cough.

## 2016-05-14 ENCOUNTER — Ambulatory Visit: Payer: Self-pay | Admitting: Family Medicine

## 2016-05-21 DIAGNOSIS — M545 Low back pain: Secondary | ICD-10-CM | POA: Diagnosis not present

## 2016-05-22 ENCOUNTER — Ambulatory Visit (INDEPENDENT_AMBULATORY_CARE_PROVIDER_SITE_OTHER): Payer: Medicare Other | Admitting: Family Medicine

## 2016-05-22 ENCOUNTER — Encounter: Payer: Self-pay | Admitting: Family Medicine

## 2016-05-22 DIAGNOSIS — Z720 Tobacco use: Secondary | ICD-10-CM

## 2016-05-22 DIAGNOSIS — F32A Depression, unspecified: Secondary | ICD-10-CM

## 2016-05-22 DIAGNOSIS — M5136 Other intervertebral disc degeneration, lumbar region: Secondary | ICD-10-CM

## 2016-05-22 DIAGNOSIS — G8929 Other chronic pain: Secondary | ICD-10-CM

## 2016-05-22 DIAGNOSIS — Z23 Encounter for immunization: Secondary | ICD-10-CM | POA: Diagnosis not present

## 2016-05-22 DIAGNOSIS — F418 Other specified anxiety disorders: Secondary | ICD-10-CM | POA: Diagnosis not present

## 2016-05-22 DIAGNOSIS — F329 Major depressive disorder, single episode, unspecified: Secondary | ICD-10-CM

## 2016-05-22 DIAGNOSIS — Z7189 Other specified counseling: Secondary | ICD-10-CM

## 2016-05-22 DIAGNOSIS — F419 Anxiety disorder, unspecified: Secondary | ICD-10-CM

## 2016-05-22 MED ORDER — HYDROCODONE-ACETAMINOPHEN 10-325 MG PO TABS: 1.0000 | ORAL_TABLET | Freq: Four times a day (QID) | ORAL | 0 refills | Status: DC | PRN

## 2016-05-22 MED ORDER — CYCLOBENZAPRINE HCL 10 MG PO TABS: ORAL_TABLET | ORAL | 0 refills | Status: DC

## 2016-05-22 NOTE — Assessment & Plan Note (Signed)
Patient plans to start nicotine patch after life stressors have decreased.

## 2016-05-22 NOTE — Progress Notes (Signed)
   Subjective:    Patient ID: Toni Chan, female    DOB: Jul 20, 1966, 50 y.o.   MRN: [id number]  HPI 50 y/o female presents for routine visit.  Lumbar DDD - seen by Dr. Lorin Mercy yesterday, started on 30 days of prednisone, considering MRI if pain not improved; has been taking Norco 10-325 four times per day, ran out a few days ago (appropriately).  Also taking Flexeril which improves muscle spasm.  Anxiety/Depression - increased stress due to social situation, taking Cymbalta 60 mg daily and Buspar 7.5 mg BID  Tobacco use - less than pack per day, reports mucous and occasional cough, if mucous gets to throat she will have emesis  Social - son was in jail for 30 days, now back in [place] with ex-girlfriend; Brother has started to contact her again (see previous notes for related issues with brother). Has started to look for houses again  Review of Systems  Constitutional: Negative for chills, fatigue and fever.  Respiratory: Negative for chest tightness and shortness of breath.   Cardiovascular: Negative for chest pain and leg swelling.       Objective:   Physical Exam BP 121/77   Pulse 84   Temp 98.1 F (36.7 C) (Oral)   Ht 5\' 3"  (1.6 m)   Wt 185 lb 12.8 oz (84.3 kg)   BMI 32.91 kg/m  Gen: pleasant female, NAD Cardiac: RRR, S1 and S2 present, no murmur Resp: CTAB, normal effort SMK: lumbar paraspinal and midline tenderness  Reviewed UDS from last month (negative for opiates)    Assessment & Plan:  Encounter for chronic pain management Patient continues to have severe back pain (see problem specific plan for Lumbar DDD). Last UDS negative for opiates. Unable to recheck UDS today as last Percocet was 3-4 days ago.  -30 day supply of Percocet supplied -will need UDS at next visit  Anxiety and depression Stable. Patient still very anxious due to social situation.  -continue Cymbalta and Buspar  Lumbar degenerative disc disease Pain uncontrolled. Seen by orthopedics  earlier this week. Started on Prednisone.  -continue Norco, Cymbalta, Flexeril, Gabapentin.    Tobacco abuse Patient plans to start nicotine patch after life stressors have decreased.

## 2016-05-22 NOTE — Patient Instructions (Signed)
It was nice to see you today.  Smoking - please start the nicotine patches when your stress decreases  Back pain - refills of Norco and Flexeril provided  Anxiety - continue Cymbalta and Buspar  Consider meeting with behavioral health specialist at next visit.

## 2016-05-22 NOTE — Assessment & Plan Note (Signed)
Stable. Patient still very anxious due to social situation.  -continue Cymbalta and Buspar

## 2016-05-22 NOTE — Assessment & Plan Note (Signed)
Patient continues to have severe back pain (see problem specific plan for Lumbar DDD). Last UDS negative for opiates. Unable to recheck UDS today as last Percocet was 3-4 days ago.  -30 day supply of Percocet supplied -will need UDS at next visit

## 2016-05-22 NOTE — Assessment & Plan Note (Signed)
Pain uncontrolled. Seen by orthopedics earlier this week. Started on Prednisone.  -continue Norco, Cymbalta, Flexeril, Gabapentin.

## 2016-06-19 ENCOUNTER — Encounter: Payer: Self-pay | Admitting: Family Medicine

## 2016-06-19 ENCOUNTER — Ambulatory Visit (INDEPENDENT_AMBULATORY_CARE_PROVIDER_SITE_OTHER): Payer: Medicare Other | Admitting: Family Medicine

## 2016-06-19 DIAGNOSIS — R32 Unspecified urinary incontinence: Secondary | ICD-10-CM | POA: Diagnosis not present

## 2016-06-19 DIAGNOSIS — Z720 Tobacco use: Secondary | ICD-10-CM

## 2016-06-19 DIAGNOSIS — Z7189 Other specified counseling: Secondary | ICD-10-CM | POA: Diagnosis not present

## 2016-06-19 DIAGNOSIS — R8290 Unspecified abnormal findings in urine: Secondary | ICD-10-CM

## 2016-06-19 DIAGNOSIS — M5030 Other cervical disc degeneration, unspecified cervical region: Secondary | ICD-10-CM | POA: Diagnosis not present

## 2016-06-19 DIAGNOSIS — M5136 Other intervertebral disc degeneration, lumbar region: Secondary | ICD-10-CM | POA: Diagnosis not present

## 2016-06-19 DIAGNOSIS — G8929 Other chronic pain: Secondary | ICD-10-CM

## 2016-06-19 MED ORDER — HYDROCODONE-ACETAMINOPHEN 10-325 MG PO TABS: 1.0000 | ORAL_TABLET | Freq: Four times a day (QID) | ORAL | 0 refills | Status: DC | PRN

## 2016-06-19 MED ORDER — DICLOFENAC SODIUM 1 % TD GEL: TRANSDERMAL | 1 refills | Status: DC

## 2016-06-19 NOTE — Progress Notes (Signed)
   Subjective:    Patient ID: Toni Chan, female    DOB: 1966/01/28, 50 y.o.   MRN: [id number]  HPI 50 y/o female presents for routine follow up.  Lumbar DDD Patient continues to have bilateral and midline low back pain, radiation to both legs, recently completed steroid burst and reports that symptoms down her left leg are improved, taking 2 Norco per day, also on Cymbalta and Lyrica  Cervical DDD Right neck/shoulder pain for the past few weeks, improved with otc cream, no associated numbness/weakness  Tobacco Abuse Down to less than 10 cigarettes per day  Urine Symptoms She reports that she has urge to urinate, associated hesitancy, needs to place toilet paper near urethra and this allows her to go, history of bladder suspension after previous hysterectomy, given kegel exercises at last visit, not having urine leakage at this time  Social - living in apartment, looking at purchasing mobile home  Review of Systems  Constitutional: Negative for chills, fatigue and fever.  Respiratory: Negative for shortness of breath.   Cardiovascular: Negative for chest pain.  Gastrointestinal: Negative for diarrhea, nausea and vomiting.       Objective:   Physical Exam BP 132/79   Pulse 90   Temp 98.2 F (36.8 C) (Oral)   Ht 5\' 3"  (1.6 m)   Wt 187 lb 9.6 oz (85.1 kg)   BMI 33.23 kg/m   Gen: pleasant female, NAD Cardiac: RRR, S1 and S2 present, no murmur Resp: CTAB: normal effort Skin: multiple well healed scars of back MSK: Cervical - no neck tenderness, right trapezius tenderness, ROM is full; Lumbar - midline and bilateral paraspinal tenderness      Assessment & Plan:  DDD (degenerative disc disease), cervical Patient has right trapezius spasm likely related to cervical DDD. -prescribed diclofenac gel -continue heat to affected area  Lumbar degenerative disc disease Symptoms stable. Slightly improved after recent steroid burst. -2 month supply of Norco  provided -continue Cymbalta and Lyrica  Tobacco abuse Smoking less than 10 cigarettes per day. -encouraged continued cessation  Leaking of urine Patient now having more urge/hesitency symptoms. Given history of hysterectomy and bladder sling will refer to urology for further evaluation.   Encounter for chronic pain management Pain controlled with current pain regimen. Noticed after visit that previous UDS was inconsistent. Will need to repeat at next visit.

## 2016-06-19 NOTE — Assessment & Plan Note (Signed)
Pain controlled with current pain regimen. Noticed after visit that previous UDS was inconsistent. Will need to repeat at next visit.

## 2016-06-19 NOTE — Assessment & Plan Note (Signed)
Patient now having more urge/hesitency symptoms. Given history of hysterectomy and bladder sling will refer to urology for further evaluation.

## 2016-06-19 NOTE — Assessment & Plan Note (Signed)
Symptoms stable. Slightly improved after recent steroid burst. -2 month supply of Norco provided -continue Cymbalta and Lyrica

## 2016-06-19 NOTE — Assessment & Plan Note (Signed)
Patient has right trapezius spasm likely related to cervical DDD. -prescribed diclofenac gel -continue heat to affected area

## 2016-06-19 NOTE — Patient Instructions (Addendum)
It was nice to see you today.  Neck /shoulder pain - start trial of Diclofenac gel to affected areas  Back pain - refill of Norco provided, continue Cymbalta and Lyrica  Bladder/Urine issues - referral to urology.

## 2016-06-19 NOTE — Assessment & Plan Note (Signed)
Smoking less than 10 cigarettes per day. -encouraged continued cessation

## 2016-06-25 ENCOUNTER — Other Ambulatory Visit: Payer: Self-pay | Admitting: Family Medicine

## 2016-06-28 ENCOUNTER — Encounter: Payer: Self-pay | Admitting: Family Medicine

## 2016-06-28 ENCOUNTER — Other Ambulatory Visit: Payer: Self-pay | Admitting: Family Medicine

## 2016-06-29 ENCOUNTER — Other Ambulatory Visit: Payer: Self-pay | Admitting: Family Medicine

## 2016-06-29 ENCOUNTER — Telehealth (HOSPITAL_COMMUNITY): Payer: Self-pay

## 2016-06-29 DIAGNOSIS — M5030 Other cervical disc degeneration, unspecified cervical region: Secondary | ICD-10-CM

## 2016-06-29 MED ORDER — DICLOFENAC SODIUM 1 % TD GEL: TRANSDERMAL | 1 refills | Status: AC

## 2016-06-29 NOTE — Telephone Encounter (Signed)
L/m to cx due to [place] has to cover Acute. NF 06/29/16

## 2016-07-02 ENCOUNTER — Ambulatory Visit (HOSPITAL_COMMUNITY): Payer: Medicare Other

## 2016-07-02 ENCOUNTER — Ambulatory Visit (INDEPENDENT_AMBULATORY_CARE_PROVIDER_SITE_OTHER): Payer: Self-pay | Admitting: Orthopaedic Surgery

## 2016-07-12 ENCOUNTER — Encounter: Payer: Self-pay | Admitting: Family Medicine

## 2016-07-13 ENCOUNTER — Other Ambulatory Visit: Payer: Self-pay | Admitting: Family Medicine

## 2016-07-13 MED ORDER — BUSPIRONE HCL 7.5 MG PO TABS: 7.5000 mg | ORAL_TABLET | Freq: Two times a day (BID) | ORAL | 0 refills | Status: DC

## 2016-07-13 MED ORDER — IBUPROFEN 800 MG PO TABS: 800.0000 mg | ORAL_TABLET | Freq: Three times a day (TID) | ORAL | 0 refills | Status: DC | PRN

## 2016-07-24 ENCOUNTER — Other Ambulatory Visit: Payer: Self-pay | Admitting: Family Medicine

## 2016-07-24 ENCOUNTER — Encounter: Payer: Self-pay | Admitting: Family Medicine

## 2016-08-13 ENCOUNTER — Telehealth: Payer: Self-pay | Admitting: Family Medicine

## 2016-08-13 NOTE — Telephone Encounter (Signed)
Spoke with pt and she is coming to her appt tomorrow. Toni Chan Kennon Holter, CMA

## 2016-08-13 NOTE — Telephone Encounter (Signed)
RN staff - please call patient and have her keep appointment tomorrow, She would need to leave her house regardless to pick up paper scripts.

## 2016-08-13 NOTE — Telephone Encounter (Signed)
RN staff - additionally could offer appointment with another provider
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
## 2016-08-14 ENCOUNTER — Ambulatory Visit (INDEPENDENT_AMBULATORY_CARE_PROVIDER_SITE_OTHER): Payer: Medicare Other | Admitting: Family Medicine

## 2016-08-14 ENCOUNTER — Encounter: Payer: Self-pay | Admitting: Family Medicine

## 2016-08-14 DIAGNOSIS — M5030 Other cervical disc degeneration, unspecified cervical region: Secondary | ICD-10-CM | POA: Diagnosis not present

## 2016-08-14 DIAGNOSIS — M5136 Other intervertebral disc degeneration, lumbar region: Secondary | ICD-10-CM | POA: Diagnosis not present

## 2016-08-14 DIAGNOSIS — R32 Unspecified urinary incontinence: Secondary | ICD-10-CM | POA: Diagnosis not present

## 2016-08-14 DIAGNOSIS — G8929 Other chronic pain: Secondary | ICD-10-CM

## 2016-08-14 DIAGNOSIS — Z720 Tobacco use: Secondary | ICD-10-CM

## 2016-08-14 MED ORDER — BUSPIRONE HCL 7.5 MG PO TABS: 7.5000 mg | ORAL_TABLET | Freq: Two times a day (BID) | ORAL | 1 refills | Status: DC

## 2016-08-14 MED ORDER — HYDROCODONE-ACETAMINOPHEN 10-325 MG PO TABS: 1.0000 | ORAL_TABLET | Freq: Four times a day (QID) | ORAL | 0 refills | Status: DC | PRN

## 2016-08-14 NOTE — Patient Instructions (Signed)
It was nice to see you today.  Dr. Ree Kida will call you with your lab results.
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
## 2016-08-15 NOTE — Assessment & Plan Note (Signed)
Patient smoking less than 10 cigarettes per day. -encouraged continued cessation

## 2016-08-15 NOTE — Assessment & Plan Note (Signed)
Symptoms of pain and radicular symptoms exacerbated by recent increase of physical activity.  -no changes in regimen -refill of Norco provided

## 2016-08-15 NOTE — Assessment & Plan Note (Signed)
Discussed UDS from 04/17/16 (no opiates present). Patient unable to explain discrepancy. Appeared appropriately concerned that urine did not show opiates. States she takes daily. Denies diversion. -one month refill of Norco provided -repeat UDS today (last dose 8 AM on day of visit)

## 2016-08-15 NOTE — Assessment & Plan Note (Signed)
Patient reports worsening numbness in bilateral hands which I believe is related to Cervical DDD.  -no change in current medication regimen -as symptoms mostly upon waking discussed setting alarm intermittently at night to facilitate changing sleep position -patient to follow up with Dr. Lorin Mercy

## 2016-08-15 NOTE — Assessment & Plan Note (Signed)
Patient reports that she was unable to make appointment with Urology due to recent move/social changes. Provided number to [facility]. Patient to call and make appointment. I am happy to make another referral if needed.

## 2016-08-24 ENCOUNTER — Telehealth: Payer: Self-pay | Admitting: Family Medicine

## 2016-08-24 LAB — PAIN MGMT, PROFILE 5 W/O MEDMATCH U
AMPHETAMINES: NEGATIVE ng/mL (ref <500)
BARBITURATES: NEGATIVE ng/mL (ref <300)
Benzodiazepines: NEGATIVE ng/mL (ref <100)
CREATININE: 144.8 mg/dL (ref >=20.0)
Cocaine Metabolite: NEGATIVE ng/mL (ref <150)
Codeine: NEGATIVE ng/mL (ref <50)
HYDROCODONE: 6453 ng/mL — AB (ref <50)
Hydromorphone: 1265 ng/mL — ABNORMAL HIGH (ref <50)
Marijuana Metabolite: NEGATIVE ng/mL (ref <20)
Methadone Metabolite: NEGATIVE ng/mL (ref <100)
Morphine: NEGATIVE ng/mL (ref <50)
NORHYDROCODONE: 5247 ng/mL — AB (ref <50)
OXIDANT: NEGATIVE ug/mL (ref <200)
OXYCODONE: NEGATIVE ng/mL (ref <100)
Opiates: POSITIVE ng/mL — AB (ref <100)
PH: 6.53 (ref 4.5–9.0)

## 2016-08-24 NOTE — Telephone Encounter (Signed)
Discussed appropriate UDS with patient.

## 2016-08-27 ENCOUNTER — Ambulatory Visit (INDEPENDENT_AMBULATORY_CARE_PROVIDER_SITE_OTHER): Payer: Medicare Other | Admitting: Orthopaedic Surgery

## 2016-08-27 ENCOUNTER — Encounter (INDEPENDENT_AMBULATORY_CARE_PROVIDER_SITE_OTHER): Payer: Self-pay | Admitting: Orthopaedic Surgery

## 2016-08-27 VITALS — BP 127/91 | HR 91 | Ht 62.0 in | Wt 178.0 lb

## 2016-08-27 DIAGNOSIS — M542 Cervicalgia: Secondary | ICD-10-CM | POA: Diagnosis not present

## 2016-08-27 NOTE — Progress Notes (Signed)
Office Visit Note   Patient: Toni Chan           Date of Birth: 01-24-1966           MRN: [id number] Visit Date: 08/27/2016              Requested by: Lupita Dawn, MD [place], [place] [address] PCP: Lupita Dawn, MD   Assessment & Plan: Visit Diagnoses:  1. Cervicalgia     Plan: Patient is now moved to a mobile home. She is out of the stressful living situation she was in but moving was stressful. MRI scan from last year demonstrated some mild narrowing at C 5-6. S level. She did not have any nerve compression. Since then taking some Flexeril during the day. She might be better if she took it only at night.  Follow-Up Instructions: No Follow-up on file.   Orders:  No orders of the defined types were placed in this encounter.  No orders of the defined types were placed in this encounter.     Procedures: No procedures performed   Clinical Data: No additional findings.   Subjective: Chief Complaint  Patient presents with  . Right Arm - Pain  . Left Arm - Pain    Patient presents with bilateral arm and hand pain. She also has numbness in both hands. She has not had any known injury, but recently moved to a mobile home.  She has been experiencing these symptoms x 2 weeks. She does have a history of bulging and protruding discs March 2016.  She is using Voltaren Gel, heat on her neck, and Hydrocodone. She has used prednisone in the past with relief.     Review of Systems  Constitutional: Negative for chills and diaphoresis.  HENT: Negative for ear discharge, ear pain and nosebleeds.   Eyes: Negative for discharge and visual disturbance.  Respiratory: Negative for cough, choking and shortness of breath.   Cardiovascular: Negative for chest pain and palpitations.  Gastrointestinal: Negative for abdominal distention and abdominal pain.  Endocrine: Negative for cold intolerance and heat intolerance.  Genitourinary: Negative for flank pain and  hematuria.  Skin: Negative for rash and wound.  Neurological: Negative for seizures and speech difficulty.  Hematological: Negative for adenopathy. Does not bruise/bleed easily.  Psychiatric/Behavioral: Negative for agitation and suicidal ideas.     Objective: Vital Signs: BP (!) 127/91   Pulse 91   Ht 5\' 2"  (1.575 m)   Wt 178 lb (80.7 kg)   BMI 32.56 kg/m   Physical Exam  Constitutional: She is oriented to person, place, and time. She appears well-developed.  HENT:  Head: Normocephalic.  Right Ear: External ear normal.  Left Ear: External ear normal.  Eyes: Pupils are equal, round, and reactive to light.  Neck: No tracheal deviation present. No thyromegaly present.  Cardiovascular: Normal rate.   Pulmonary/Chest: Effort normal.  Abdominal: Soft.  Neurological: She is alert and oriented to person, place, and time.  Skin: Skin is warm and dry.  Psychiatric: She has a normal mood and affect. Her behavior is normal.    Ortho Exam upper extremity reflexes are 2+ and symmetrical. Negative impingement the shoulders. She has pain with cervical compression some relief with distraction tenderness of the trapezial muscles.  Specialty Comments:  No specialty comments available.  Imaging: No results found.   PMFS History: Patient Active Problem List   Diagnosis Date Noted  . Cervicalgia 08/27/2016  . Viral illness 04/28/2016  .  Leaking of urine 04/17/2016  . Chronic abdominal pain 12/27/2015  . Tobacco abuse 12/27/2015  . Right shoulder pain 09/06/2015  . Allergic rhinitis 08/08/2015  . Constipation 07/19/2015  . Social anxiety disorder 07/19/2015  . Rash and nonspecific skin eruption 06/13/2015  . DDD (degenerative disc disease), cervical 04/23/2015  . S/P lumbar fusion 02/27/2015  . Encounter for chronic pain management 01/02/2015  . Hypercholesteremia 12/04/2014  . Lumbar degenerative disc disease 12/03/2014  . Preventative health care 12/03/2014  . Endometriosis  12/03/2014  . GERD (gastroesophageal reflux disease) 12/03/2014  . Anxiety and depression 12/03/2014   Past Medical History:  Diagnosis Date  . Abdominal pain, left upper quadrant 07/05/2015  . Anxiety   . Arthritis   . Cervical cancer ([place]) 1989   Vaginal hysterectomy and oophorectomy 1993  . Chronic back pain   . Chronic neck pain   . Depression   . Endometriosis   . GERD (gastroesophageal reflux disease)   . Hx MRSA infection 2014   in the area of the scar fr. having coccyx removal  . IBS (irritable bowel syndrome)   . Kidney stones 2014  . Pilonidal cyst 2015  . Placental abruption   . Pneumonia   . Renal cyst 2014  . Superficial thrombophlebitis     Family History  Problem Relation Age of Onset  . Depression Mother   . Alcohol abuse Father   . Heart disease Father   . Kidney disease Father   . Alcohol abuse Brother   . Heart disease Brother   . Drug abuse Brother     Past Surgical History:  Procedure Laterality Date  . ABDOMINAL HYSTERECTOMY     laparoscopic  . BACK SURGERY  2013, 2014 [place]  . COCCYX REMOVAL  2014  . COLONOSCOPY    . LUMBAR FUSION  02/27/2015   L4    . TONSILLECTOMY     Social History   Occupational History  . Not on file.   Social History Main Topics  . Smoking status: Current Every Day Smoker    Packs/day: 0.50    Years: 3.00    Types: Cigarettes  . Smokeless tobacco: Never Used  . Alcohol use No  . Drug use: No  . Sexual activity: No

## 2016-09-09 ENCOUNTER — Other Ambulatory Visit: Payer: Self-pay | Admitting: Family Medicine

## 2016-09-09 ENCOUNTER — Encounter: Payer: Self-pay | Admitting: Family Medicine

## 2016-09-11 ENCOUNTER — Other Ambulatory Visit: Payer: Self-pay | Admitting: Family Medicine

## 2016-09-11 MED ORDER — CYCLOBENZAPRINE HCL 10 MG PO TABS: ORAL_TABLET | ORAL | 2 refills | Status: DC

## 2016-09-11 MED ORDER — HYDROCODONE-ACETAMINOPHEN 10-325 MG PO TABS: 1.0000 | ORAL_TABLET | Freq: Four times a day (QID) | ORAL | 0 refills | Status: DC | PRN

## 2016-09-11 NOTE — Telephone Encounter (Signed)
Pt called and would really like to speak to Dr. Ree Kida. She also requested to have refills left up front for her to pick up. Cyclobenzaprine and Hydrcodone. jw

## 2016-09-25 ENCOUNTER — Encounter: Payer: Self-pay | Admitting: Family Medicine

## 2016-09-25 ENCOUNTER — Other Ambulatory Visit: Payer: Self-pay | Admitting: *Deleted

## 2016-09-25 MED ORDER — PREGABALIN 75 MG PO CAPS: 75.0000 mg | ORAL_CAPSULE | Freq: Three times a day (TID) | ORAL | 1 refills | Status: DC

## 2016-09-25 NOTE — Telephone Encounter (Signed)
RN staff - please call in Lyrica 75 mg TID, dispense #270, one refill.

## 2016-09-25 NOTE — Telephone Encounter (Signed)
Rx called into pharmacy. Nat Christen, CMA

## 2016-09-30 ENCOUNTER — Encounter: Payer: Self-pay | Admitting: Family Medicine

## 2016-09-30 DIAGNOSIS — Z114 Encounter for screening for human immunodeficiency virus [HIV]: Secondary | ICD-10-CM | POA: Insufficient documentation

## 2016-09-30 NOTE — Progress Notes (Signed)
   Subjective:    Patient ID: Toni Chan, female    DOB: 1966/06/14, 51 y.o.   MRN: [id number]  HPI 51 y/o female presents for routine follow up.  Chronic Pain/Lumbar DDD Prescribed Norco 10-325 Q 6 hrs prn pain (only taking BID), Ibuprofen 800 mg TID prn (takes rarely for headaches), Flexeril 10 mg TID prn (see below), Cymbalta 60 mg daily, and Lyrica 75 mg TID (1 in AM and 2 QHS). Back pain 5/10 (improved with current pain regimen).   Cervical DDD Voltaran gell to affected area. Recently seen by Orthopedics (Dr. Lorin Mercy) who recommended conservative therapy. Has been taking  2 at night. Hand numbness is improved with current regimen, no longer waking up at night.   Tobacco Abuse Still smoking 5-7 cigarettes per day  Anxiety Notes increased pain with increased stress, still taking Cymbalta and Buspar   Leakage of Urine Requests another referral to urology.   HM Due for HIV screen  Chest Pain December 18th, Blowing leaves, felt acute onset of anterior chest pain, associated shortness of breath, heart rate elevated to 106, symptoms lasted 30 minutes, no anxiety prior to this, resolved spontaneously,  Had another episode on December 31st, associated lightheadedness, anterior chest pain, HR up to 107, was over at strangers house during this episode, this episode was associated with stress FM of CAD (father)  Review of Systems See above    Objective:   Physical Exam BP 130/76   Pulse (!) 105   Temp 98.1 F (36.7 C) (Oral)   Ht 5\' 2"  (1.575 m)   Wt 184 lb 9.6 oz (83.7 kg)   BMI 33.76 kg/m  Gen: pleasant female, NAD Cardiac: RRR, S1 and S2 present, no murmur Resp: CTAB, normal work of breathing Psych: well groomed, no flight of ideas, affect is outgoing  EKG: NSR, no ischemic changes     Assessment & Plan:  Chest pain Intermittent chest pain. Clinically suspect due to anxiety however given FH of CAD and current smoker will send to cardiology for further  evaluation.  DDD (degenerative disc disease), cervical Pain stable on Norco, Cymbalta, Lyrica, and Flexeril.   Encounter for chronic pain management Pain stable on Norco, Cymbalta, Lyrica, and Flexeril.   Lumbar degenerative disc disease Pain stable on Norco, Cymbalta, Lyrica, and Flexeril.   Tobacco abuse Encouraged continued cessation. Smoking 5-7 per day.   Screening for HIV (human immunodeficiency virus) HIV screen today.

## 2016-10-01 ENCOUNTER — Ambulatory Visit (INDEPENDENT_AMBULATORY_CARE_PROVIDER_SITE_OTHER): Payer: Medicare Other | Admitting: Family Medicine

## 2016-10-01 ENCOUNTER — Ambulatory Visit (HOSPITAL_COMMUNITY)
Admission: RE | Admit: 2016-10-01 | Discharge: 2016-10-01 | Disposition: A | Discharge status: Home / Self Care | Payer: Medicare Other | Source: Ambulatory Visit | Attending: Family Medicine | Admitting: Family Medicine

## 2016-10-01 ENCOUNTER — Encounter: Payer: Self-pay | Admitting: Family Medicine

## 2016-10-01 DIAGNOSIS — R32 Unspecified urinary incontinence: Secondary | ICD-10-CM | POA: Diagnosis not present

## 2016-10-01 DIAGNOSIS — I209 Angina pectoris, unspecified: Secondary | ICD-10-CM | POA: Diagnosis not present

## 2016-10-01 DIAGNOSIS — Z114 Encounter for screening for human immunodeficiency virus [HIV]: Secondary | ICD-10-CM

## 2016-10-01 DIAGNOSIS — I259 Chronic ischemic heart disease, unspecified: Secondary | ICD-10-CM

## 2016-10-01 DIAGNOSIS — R0789 Other chest pain: Secondary | ICD-10-CM | POA: Insufficient documentation

## 2016-10-01 DIAGNOSIS — M5030 Other cervical disc degeneration, unspecified cervical region: Secondary | ICD-10-CM | POA: Diagnosis not present

## 2016-10-01 DIAGNOSIS — M5136 Other intervertebral disc degeneration, lumbar region: Secondary | ICD-10-CM

## 2016-10-01 DIAGNOSIS — E7800 Pure hypercholesterolemia, unspecified: Secondary | ICD-10-CM

## 2016-10-01 DIAGNOSIS — G8929 Other chronic pain: Secondary | ICD-10-CM

## 2016-10-01 DIAGNOSIS — Z720 Tobacco use: Secondary | ICD-10-CM

## 2016-10-01 LAB — COMPLETE METABOLIC PANEL WITH GFR
ALBUMIN: 4.4 g/dL (ref 3.6–5.1)
ALK PHOS: 111 U/L (ref 33–115)
ALT: 38 U/L — AB (ref 6–29)
AST: 27 U/L (ref 10–35)
BUN: 7 mg/dL (ref 7–25)
CO2: 25 mmol/L (ref 20–31)
CREATININE: 0.83 mg/dL (ref 0.50–1.10)
Calcium: 9.5 mg/dL (ref 8.6–10.2)
Chloride: 102 mmol/L (ref 98–110)
GFR, Est African American: >89 mL/min (ref >=60)
GFR, Est Non African American: 84 mL/min (ref >=60)
GLUCOSE: 72 mg/dL (ref 65–99)
POTASSIUM: 4.3 mmol/L (ref 3.5–5.3)
SODIUM: 140 mmol/L (ref 135–146)
Total Bilirubin: 0.4 mg/dL (ref 0.2–1.2)
Total Protein: 7 g/dL (ref 6.1–8.1)

## 2016-10-01 MED ORDER — HYDROCODONE-ACETAMINOPHEN 10-325 MG PO TABS: 1.0000 | ORAL_TABLET | Freq: Four times a day (QID) | ORAL | 0 refills | Status: DC | PRN

## 2016-10-01 NOTE — Assessment & Plan Note (Signed)
Pain stable on Norco, Cymbalta, Lyrica, and Flexeril.

## 2016-10-01 NOTE — Assessment & Plan Note (Signed)
Encouraged continued cessation. Smoking 5-7 per day.

## 2016-10-01 NOTE — Assessment & Plan Note (Signed)
Intermittent chest pain. Clinically suspect due to anxiety however given FH of CAD and current smoker will send to cardiology for further evaluation.

## 2016-10-01 NOTE — Assessment & Plan Note (Signed)
- 

## 2016-10-01 NOTE — Patient Instructions (Signed)
It was nice to see you today. I am glad to hear that your back and neck pain have improved.  I have given you a refill of your Norco.   I have placed a referral to cardiology for you. My office will contact you to schedule this.   Please return for follow up in 2 months.

## 2016-10-02 ENCOUNTER — Encounter: Payer: Self-pay | Admitting: Family Medicine

## 2016-10-02 LAB — HIV ANTIBODY (ROUTINE TESTING W REFLEX): HIV 1&2 Ab, 4th Generation: NONREACTIVE

## 2016-10-07 ENCOUNTER — Encounter: Payer: Self-pay | Admitting: Family Medicine

## 2016-10-12 ENCOUNTER — Other Ambulatory Visit: Payer: Self-pay | Admitting: Family Medicine

## 2016-10-19 MED ORDER — CYCLOBENZAPRINE HCL 10 MG PO TABS: 10.0000 mg | ORAL_TABLET | Freq: Three times a day (TID) | ORAL | 0 refills | Status: DC | PRN

## 2016-10-19 NOTE — Telephone Encounter (Signed)
RN staff - please call in Flexeril 10 mg TID prn muscle spasm, dispense #90, no refill, thanks

## 2016-10-20 NOTE — Telephone Encounter (Signed)
Rx called into [facility].

## 2016-10-29 ENCOUNTER — Ambulatory Visit: Payer: Self-pay | Admitting: Internal Medicine

## 2016-11-12 ENCOUNTER — Encounter: Payer: Self-pay | Admitting: Family Medicine

## 2016-11-16 ENCOUNTER — Encounter: Payer: Self-pay | Admitting: Family Medicine

## 2016-11-18 ENCOUNTER — Other Ambulatory Visit: Payer: Self-pay | Admitting: Family Medicine

## 2016-11-18 NOTE — Telephone Encounter (Signed)
Pt would like Dr. Ree Kida to call her regarding refilling her pain medication. Pt states she will be in town tomorrow for another appointment and doesn't have enough to last until next appointment with Dr. Ree Kida (12-11-16). ep

## 2016-11-19 ENCOUNTER — Ambulatory Visit (INDEPENDENT_AMBULATORY_CARE_PROVIDER_SITE_OTHER): Payer: Medicare Other | Admitting: Internal Medicine

## 2016-11-19 ENCOUNTER — Encounter: Payer: Self-pay | Admitting: Internal Medicine

## 2016-11-19 VITALS — BP 110/84 | HR 82 | Ht 62.0 in | Wt 187.0 lb

## 2016-11-19 DIAGNOSIS — R0789 Other chest pain: Secondary | ICD-10-CM | POA: Diagnosis not present

## 2016-11-19 DIAGNOSIS — E785 Hyperlipidemia, unspecified: Secondary | ICD-10-CM | POA: Diagnosis not present

## 2016-11-19 DIAGNOSIS — R002 Palpitations: Secondary | ICD-10-CM

## 2016-11-19 MED ORDER — CYCLOBENZAPRINE HCL 10 MG PO TABS: 10.0000 mg | ORAL_TABLET | Freq: Three times a day (TID) | ORAL | 0 refills | Status: DC | PRN

## 2016-11-19 MED ORDER — HYDROCODONE-ACETAMINOPHEN 10-325 MG PO TABS: 1.0000 | ORAL_TABLET | Freq: Four times a day (QID) | ORAL | 0 refills | Status: DC | PRN

## 2016-11-19 NOTE — Progress Notes (Signed)
New Outpatient Visit Date: 11/19/2016  Referring Provider: Lupita Dawn, MD [place], [place] [address]  Chief Complaint: Chest pain  HPI:  [name] is a 51 y.o. year-old female with history of dyslipidemia, GERD, chronic pain, irritable bowel syndrome, superficial phlebitis, cervical cancer, depression, and anxiety, who has been referred by Dr. Ree Kida for evaluation of chest pain. Patient reports 2 episodes of chest discomfort and tightness that initiates just below the left clavicle and then extends lower to the left breast. The first episode occurred in late December while the patient was doing light yard work. He describes a pressure sensation, 7/10 in intensity, which was associated with nausea, diaphoresis, and dizziness. She sat down, rested, and took omeprazole, which typically helps her heartburn. She did not experience relief with omeprazole although her symptoms spontaneously resolved after about 45 minutes. She had a similar episode 2 weeks later while seated in bed. She has not had any exertional symptoms, including dyspnea and chest pain. She continues to have intermittent heartburn.  Toni Chan reports palpitations at times as well, during which it feels as though her heart is beating more vigorously (though not faster or irregular). She also reports "feeling jittery" with accompanying lightheadedness from time to time. She denies edema, orthopnea, PND and claudication. She drinks 2 cups of coffee per day as well as several soft drinks. She is never undergone previous cardiovascular workup.  --------------------------------------------------------------------------------------------------  Cardiovascular History & Procedures: Cardiovascular Problems:  Atypical chest pain  Palpitations  Risk Factors:  Dyslipidemia, tobacco use, and family history  Cath/PCI:  None  CV Surgery:  None  EP Procedures and Devices:  None  Non-Invasive  Evaluation(s):  None  Recent CV Pertinent Labs: Lab Results  Component Value Date   CHOL 259 (H) 12/27/2015   HDL 34 (L) 12/27/2015   LDLCALC 148 (H) 12/27/2015   TRIG 387 (H) 12/27/2015   CHOLHDL 7.6 (H) 12/27/2015   INR 1.03 02/20/2015   K 4.3 10/01/2016   MG 1.8 02/28/2015   BUN 7 10/01/2016   CREATININE 0.83 10/01/2016    --------------------------------------------------------------------------------------------------  Past Medical History:  Diagnosis Date  . Abdominal pain, left upper quadrant 07/05/2015  . Anxiety   . Arthritis   . Cervical cancer ([place]) 1989   Vaginal hysterectomy and oophorectomy 1993  . Chronic back pain   . Chronic neck pain   . Depression   . Dyslipidemia   . Endometriosis   . GERD (gastroesophageal reflux disease)   . Hx MRSA infection 2014   in the area of the scar fr. having coccyx removal  . IBS (irritable bowel syndrome)   . Kidney stones 2014  . Pilonidal cyst 2015  . Placental abruption   . Pneumonia   . Rash and nonspecific skin eruption 06/13/2015   Skin Biopsy 02/2016 Skin , left lower calf FAVOR PSORIASIS, EVOLVING LESION ON STASIS ALTERED SKIN   . Renal cyst 2014  . Superficial thrombophlebitis     Past Surgical History:  Procedure Laterality Date  . ABDOMINAL HYSTERECTOMY     laparoscopic  . BACK SURGERY  2013, 2014 [place]  . COCCYX REMOVAL  2014  . COLONOSCOPY    . LUMBAR FUSION  02/27/2015   L4    . TONSILLECTOMY      Outpatient Encounter Prescriptions as of 11/19/2016  Medication Sig  . busPIRone (BUSPAR) 7.5 MG tablet Take 1 tablet (7.5 mg total) by mouth 2 (two) times daily.  Marland Kitchen  cetirizine (ZYRTEC) 10 MG tablet Take 1 tablet (10 mg total) by mouth daily.  . cyclobenzaprine (FLEXERIL) 10 MG tablet Take 1 tablet (10 mg total) by mouth 3 (three) times daily as needed for muscle spasms.  . diclofenac sodium (VOLTAREN) 1 % GEL Apply to affected areas twice daily as needed.  . DULoxetine  (CYMBALTA) 60 MG capsule TAKE 1 CAPSULE BY MOUTH ONCE DAILY  . fluticasone (FLONASE) 50 MCG/ACT nasal spray Place 2 sprays into both nostrils daily.  Marland Kitchen HYDROcodone-acetaminophen (NORCO) 10-325 MG tablet Take 1 tablet by mouth every 6 (six) hours as needed.  Marland Kitchen ibuprofen (ADVIL,MOTRIN) 800 MG tablet Take 1 tablet (800 mg total) by mouth every 8 (eight) hours as needed.  Marland Kitchen omeprazole (PRILOSEC) 20 MG capsule TAKE 1 CAPSULE BY MOUTH ONCE DAILY  . polyethylene glycol powder (GLYCOLAX/MIRALAX) powder Take 17 g by mouth daily.  . pregabalin (LYRICA) 75 MG capsule Take 1 capsule (75 mg total) by mouth 3 (three) times daily.  Marland Kitchen senna (SENOKOT) 8.6 MG tablet Take 3 tablets by mouth at bedtime.  . triamcinolone ointment (KENALOG) 0.5 % Apply 1 application topically 2 (two) times daily.  . [DISCONTINUED] atorvastatin (LIPITOR) 40 MG tablet Take 1 tablet (40 mg total) by mouth daily.   No facility-administered encounter medications on file as of 11/19/2016.     Allergies: Patient has no known allergies.  Social History   Social History  . Marital status: Single    Spouse name: N/A  . Number of children: N/A  . Years of education: N/A   Occupational History  . Not on file.   Social History Main Topics  . Smoking status: Current Every Day Smoker    Packs/day: 0.50    Years: 3.00    Types: Cigarettes  . Smokeless tobacco: Never Used  . Alcohol use No  . Drug use: No  . Sexual activity: No   Other Topics Concern  . Not on file   Social History Narrative   Grew up in [place]. Recently moved back to area from [facility] (10/2014). Has 2 adult children      11/2014   Does not exercise regularly   Former smoker (quit 2009)   Denies recreational drug use   Denies alcohol use   Owns car    Family History  Problem Relation Age of Onset  . Depression Mother   . Alcohol abuse Father   . Heart disease Father   . Kidney disease Father   . Alcohol abuse Brother   . Heart disease  Brother   . Drug abuse Brother     Review of Systems: Review of Systems  Constitutional: Positive for diaphoresis and malaise/fatigue. Negative for chills, fever and weight loss (weight gain).  HENT: Negative.   Eyes: Negative.   Respiratory: Positive for wheezing.   Cardiovascular: Positive for chest pain and palpitations.  Gastrointestinal: Positive for abdominal pain, heartburn and nausea.  Genitourinary: Negative for hematuria.       Difficulty urinating; planning urologic evaluation in the near future.  Musculoskeletal: Positive for back pain and myalgias.  Skin: Positive for rash.  Neurological: Positive for dizziness, tremors (jittery), loss of consciousness and headaches.  Endo/Heme/Allergies: Bruises/bleeds easily.  Psychiatric/Behavioral: Positive for depression. The patient is nervous/anxious.    --------------------------------------------------------------------------------------------------  Physical Exam: BP 110/84 (BP Location: Left Arm, Patient Position: Sitting, Cuff Size: Normal)   Pulse 82   Ht 5\' 2"  (1.575 m)   Wt 187 lb (84.8 kg)   SpO2 97%  BMI 34.20 kg/m   General:  Obese woman, seated comfortably in the exam room. HEENT: No conjunctival pallor or scleral icterus.  Moist mucous membranes.  OP clear. Neck: Supple without lymphadenopathy, thyromegaly, JVD, or HJR.  No carotid bruit. Lungs: Normal work of breathing.  Clear to auscultation bilaterally without wheezes or crackles. Heart: Regular rate and rhythm without murmurs, rubs, or gallops.  Non-displaced PMI. Abd: Bowel sounds present.  Soft, NT/ND without hepatosplenomegaly Ext: No lower extremity edema.  Radial, PT, and DP pulses are 2+ bilaterally Skin: warm and dry without rash Neuro: CNIII-XII intact.  Strength and fine-touch sensation intact in upper and lower extremities bilaterally. Psych: Normal mood and affect.  EKG:  Normal sinus rhythm without significant abnormalities or changes from  prior tracing on 10/01/16 (I have personally reviewed both tracings).  Lab Results  Component Value Date   WBC 9.2 10/08/2015   HGB 13.5 10/08/2015   HCT 40.8 10/08/2015   MCV 94.0 10/08/2015   PLT 257 10/08/2015    Lab Results  Component Value Date   NA 140 10/01/2016   K 4.3 10/01/2016   CL 102 10/01/2016   CO2 25 10/01/2016   BUN 7 10/01/2016   CREATININE 0.83 10/01/2016   GLUCOSE 72 10/01/2016   ALT 38 (H) 10/01/2016    Lab Results  Component Value Date   CHOL 259 (H) 12/27/2015   HDL 34 (L) 12/27/2015   LDLCALC 148 (H) 12/27/2015   TRIG 387 (H) 12/27/2015   CHOLHDL 7.6 (H) 12/27/2015    --------------------------------------------------------------------------------------------------  ASSESSMENT AND PLAN: Atypical chest pain Patient reports left-sided chest tightness that can occur both with rest and activity. Interestingly, she has been asymptomatic at other times with exertion. Cardiovascular risk factors include dyslipidemia, family history, and tobacco use. Her EKG today is normal. We have discussed further evaluation options and have agreed to perform a myocardial perfusion stress test. We will attempt this with exercise, though it may need to be converted to a pharmacologic study due to the patient's chronic pain issues. We will not make any medication changes today. I counseled the patient to stop smoking.  Palpitations and lightheadedness Symptoms are nonspecific and are not always associated with the aforementioned chest pain. Given that she experiences palpitations on a daily basis, we will obtain a 48-hour Holter monitor. I will also check a TSH to exclude hyperthyroidism. I advised the patient to limit her caffeine consumption as much as possible.  Dyslipidemia Prior lipid panel in 11/2015 demonstrated low HDL, mildly elevated LDL, and mildly elevated triglycerides. We will repeat this today.  Follow-up: Return to clinic in 4 weeks.  Nelva Bush,  MD 11/21/2016 11:26 AM

## 2016-11-19 NOTE — Telephone Encounter (Signed)
Called about her pain meds again

## 2016-11-19 NOTE — Telephone Encounter (Signed)
Refilled Norco and Flexeril. Placed paper scripts in the front office for pickup.

## 2016-11-19 NOTE — Patient Instructions (Signed)
Medication Instructions:  Your physician recommends that you continue on your current medications as directed. Please refer to the Current Medication list given to you today.   Labwork: Lipid profile/TSH today  Testing/Procedures: Your physician has requested that you have en exercise stress myoview. For further information please visit [email or web address]. Please follow instruction sheet, as given.  Your physician has recommended that you wear a holter monitor. Holter monitors are medical devices that record the heart's electrical activity. Doctors most often use these monitors to diagnose arrhythmias. Arrhythmias are problems with the speed or rhythm of the heartbeat. The monitor is a small, portable device. You can wear one while you do your normal daily activities. This is usually used to diagnose what is causing palpitations/syncope (passing out).  24 HOUR   Follow-Up: Your physician recommends that you schedule a follow-up appointment in: 4 weeks with Dr End.         If you need a refill on your cardiac medications before your next appointment, please call your pharmacy.

## 2016-11-20 LAB — LIPID PANEL
CHOL/HDL RATIO: 16 — AB (ref 0.0–4.4)
Cholesterol, Total: 352 mg/dL — ABNORMAL HIGH (ref 100–199)
HDL: 22 mg/dL — ABNORMAL LOW (ref >39)
Triglycerides: 1016 mg/dL (ref 0–149)

## 2016-11-20 LAB — TSH: TSH: 2.21 u[IU]/mL (ref 0.450–4.500)

## 2016-11-21 ENCOUNTER — Encounter: Payer: Self-pay | Admitting: Internal Medicine

## 2016-11-21 DIAGNOSIS — R002 Palpitations: Secondary | ICD-10-CM | POA: Insufficient documentation

## 2016-11-21 DIAGNOSIS — E785 Hyperlipidemia, unspecified: Secondary | ICD-10-CM | POA: Insufficient documentation

## 2016-11-23 ENCOUNTER — Telehealth: Payer: Self-pay | Admitting: *Deleted

## 2016-11-23 DIAGNOSIS — E785 Hyperlipidemia, unspecified: Secondary | ICD-10-CM

## 2016-11-23 DIAGNOSIS — E7800 Pure hypercholesterolemia, unspecified: Secondary | ICD-10-CM

## 2016-11-23 NOTE — Telephone Encounter (Signed)
Notes Recorded by Nelva Bush, MD on 11/20/2016 at 6:52 AM EST Please let the patient know that her triglycerides are significantly elevated. Please have her start taking fish oil 1000 mg BID and repeat a fasting lipid panel when she returns for her stress test. Thanks.

## 2016-11-29 DIAGNOSIS — R079 Chest pain, unspecified: Secondary | ICD-10-CM | POA: Diagnosis not present

## 2016-11-29 DIAGNOSIS — M79644 Pain in right finger(s): Secondary | ICD-10-CM | POA: Diagnosis not present

## 2016-11-29 DIAGNOSIS — F17210 Nicotine dependence, cigarettes, uncomplicated: Secondary | ICD-10-CM | POA: Diagnosis not present

## 2016-11-29 DIAGNOSIS — M797 Fibromyalgia: Secondary | ICD-10-CM | POA: Diagnosis not present

## 2016-11-29 DIAGNOSIS — Z590 Homelessness: Secondary | ICD-10-CM | POA: Diagnosis not present

## 2016-11-29 DIAGNOSIS — R52 Pain, unspecified: Secondary | ICD-10-CM | POA: Diagnosis not present

## 2016-11-29 DIAGNOSIS — R0789 Other chest pain: Secondary | ICD-10-CM | POA: Diagnosis not present

## 2016-11-30 DIAGNOSIS — M79644 Pain in right finger(s): Secondary | ICD-10-CM | POA: Diagnosis not present

## 2016-11-30 DIAGNOSIS — R079 Chest pain, unspecified: Secondary | ICD-10-CM | POA: Diagnosis not present

## 2016-12-07 ENCOUNTER — Telehealth (HOSPITAL_COMMUNITY): Payer: Self-pay | Admitting: *Deleted

## 2016-12-07 NOTE — Telephone Encounter (Signed)
Attempted to leave message on voicemail in reference to upcoming appointment scheduled for 12/09/16 but phone # changed or disconnected. Attempted to call emergency contact and he did not have a current #. Phone number given for a call back so details instructions can be given. Ruble Pumphrey, Ranae Palms

## 2016-12-09 ENCOUNTER — Other Ambulatory Visit: Payer: Medicare Other | Admitting: *Deleted

## 2016-12-09 ENCOUNTER — Ambulatory Visit (HOSPITAL_COMMUNITY): Payer: Medicare Other | Attending: Cardiovascular Disease

## 2016-12-09 ENCOUNTER — Telehealth: Payer: Self-pay | Admitting: *Deleted

## 2016-12-09 ENCOUNTER — Ambulatory Visit (INDEPENDENT_AMBULATORY_CARE_PROVIDER_SITE_OTHER): Payer: Medicare Other

## 2016-12-09 DIAGNOSIS — E785 Hyperlipidemia, unspecified: Secondary | ICD-10-CM | POA: Diagnosis not present

## 2016-12-09 DIAGNOSIS — R002 Palpitations: Secondary | ICD-10-CM

## 2016-12-09 DIAGNOSIS — R079 Chest pain, unspecified: Secondary | ICD-10-CM | POA: Diagnosis not present

## 2016-12-09 DIAGNOSIS — R0789 Other chest pain: Secondary | ICD-10-CM

## 2016-12-09 LAB — LIPID PANEL
CHOLESTEROL TOTAL: 331 mg/dL — AB (ref 100–199)
Chol/HDL Ratio: 14.4 ratio units — ABNORMAL HIGH (ref 0.0–4.4)
HDL: 23 mg/dL — ABNORMAL LOW (ref >39)
Triglycerides: 827 mg/dL (ref 0–149)

## 2016-12-09 LAB — MYOCARDIAL PERFUSION IMAGING
CHL CUP NUCLEAR SDS: 4
CHL CUP NUCLEAR SRS: 3
CHL CUP NUCLEAR SSS: 7
LV dias vol: 69 mL (ref 46–106)
LV sys vol: 20 mL
NUC STRESS TID: 0.91
Peak HR: 93 {beats}/min
RATE: 0.33
Rest HR: 76 {beats}/min

## 2016-12-09 MED ORDER — TECHNETIUM TC 99M TETROFOSMIN IV KIT
10.9000 | PACK | Freq: Once | INTRAVENOUS | Status: AC | PRN
  Administered 2016-12-09: 10.9 via INTRAVENOUS
  Filled 2016-12-09: qty 11

## 2016-12-09 MED ORDER — REGADENOSON 0.4 MG/5ML IV SOLN
0.4000 mg | Freq: Once | INTRAVENOUS | Status: AC
  Administered 2016-12-09: 0.4 mg via INTRAVENOUS

## 2016-12-09 MED ORDER — TECHNETIUM TC 99M TETROFOSMIN IV KIT
30.6000 | PACK | Freq: Once | INTRAVENOUS | Status: AC | PRN
  Administered 2016-12-09: 30.6 via INTRAVENOUS
  Filled 2016-12-09: qty 31

## 2016-12-09 NOTE — Telephone Encounter (Signed)
Notes Recorded by Nelva Bush, MD on 12/09/2016 at 3:52 PM EDT Hi Anne, can we add a direct LDL onto this morning's lipid panel? Thanks.

## 2016-12-11 ENCOUNTER — Ambulatory Visit (INDEPENDENT_AMBULATORY_CARE_PROVIDER_SITE_OTHER): Payer: Medicare Other | Admitting: Family Medicine

## 2016-12-11 ENCOUNTER — Encounter: Payer: Self-pay | Admitting: Family Medicine

## 2016-12-11 ENCOUNTER — Encounter: Payer: Self-pay | Admitting: Internal Medicine

## 2016-12-11 DIAGNOSIS — M5030 Other cervical disc degeneration, unspecified cervical region: Secondary | ICD-10-CM | POA: Diagnosis not present

## 2016-12-11 DIAGNOSIS — F32A Depression, unspecified: Secondary | ICD-10-CM

## 2016-12-11 DIAGNOSIS — F418 Other specified anxiety disorders: Secondary | ICD-10-CM | POA: Diagnosis not present

## 2016-12-11 DIAGNOSIS — M5136 Other intervertebral disc degeneration, lumbar region: Secondary | ICD-10-CM

## 2016-12-11 DIAGNOSIS — G8929 Other chronic pain: Secondary | ICD-10-CM | POA: Diagnosis not present

## 2016-12-11 DIAGNOSIS — F329 Major depressive disorder, single episode, unspecified: Secondary | ICD-10-CM

## 2016-12-11 DIAGNOSIS — E785 Hyperlipidemia, unspecified: Secondary | ICD-10-CM

## 2016-12-11 DIAGNOSIS — F419 Anxiety disorder, unspecified: Secondary | ICD-10-CM

## 2016-12-11 LAB — SPECIMEN STATUS REPORT

## 2016-12-11 LAB — LDL CHOLESTEROL, DIRECT: LDL DIRECT: 115 mg/dL — AB (ref 0–99)

## 2016-12-11 MED ORDER — HYDROCODONE-ACETAMINOPHEN 10-325 MG PO TABS: 1.0000 | ORAL_TABLET | Freq: Four times a day (QID) | ORAL | 0 refills | Status: DC | PRN

## 2016-12-11 MED ORDER — ATORVASTATIN CALCIUM 40 MG PO TABS: 40.0000 mg | ORAL_TABLET | Freq: Every day | ORAL | 3 refills | Status: DC

## 2016-12-11 MED ORDER — ASPIRIN EC 81 MG PO TBEC: 81.0000 mg | DELAYED_RELEASE_TABLET | Freq: Every day | ORAL | 2 refills | Status: DC

## 2016-12-11 NOTE — Assessment & Plan Note (Addendum)
Pain and function improved with therapy. Refilled Narcotics for 2 months.

## 2016-12-11 NOTE — Assessment & Plan Note (Signed)
Stable.  -continue Cymbalta and Buspar

## 2016-12-11 NOTE — Patient Instructions (Signed)
It was nice to see you today.  No changes in your medications.  Please attempt to increase your activity level gradually. Consider getting involved with an organization/activity outside of the home.
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
## 2016-12-11 NOTE — Assessment & Plan Note (Signed)
ASCVD calculated at 22.3% over next 10 years. -start Lipitor 40 mg in addition to fish oil per cardiology

## 2016-12-11 NOTE — Assessment & Plan Note (Signed)
Pain stable on Norco, Gabapentin, Cymbalta, and Flexeril. -refilled Norco for 2 months, continue to wean as tolerated.

## 2016-12-11 NOTE — Assessment & Plan Note (Signed)
Improved with nighttime dosing of Flexeril.  -continue current therapy

## 2016-12-15 ENCOUNTER — Telehealth: Payer: Self-pay | Admitting: Internal Medicine

## 2016-12-15 MED ORDER — FENOFIBRATE 48 MG PO TABS
48.0000 mg | ORAL_TABLET | Freq: Every day | ORAL | 5 refills | Status: DC
Start: 2016-12-15 — End: 2017-06-10

## 2016-12-15 NOTE — Telephone Encounter (Signed)
I spoke with the patient regarding results of her stress test and lipid panel. She has significant hypertriglyceridemia despite being on fish oil and atorvastatin. We have agreed to start fenofibrate 48 mg daily and recheck lipid panel in about 2 months. We discussed the risks for myopathy with the medication. She should contact us immediately if she develops muscle aches.

## 2016-12-18 ENCOUNTER — Encounter: Payer: Self-pay | Admitting: Internal Medicine

## 2016-12-31 ENCOUNTER — Ambulatory Visit: Payer: Self-pay | Admitting: Internal Medicine

## 2017-01-13 ENCOUNTER — Ambulatory Visit: Payer: Self-pay | Admitting: Urology

## 2017-01-18 ENCOUNTER — Other Ambulatory Visit: Payer: Self-pay | Admitting: Family Medicine

## 2017-01-19 ENCOUNTER — Encounter: Payer: Self-pay | Admitting: Family Medicine

## 2017-01-20 ENCOUNTER — Other Ambulatory Visit: Payer: Self-pay | Admitting: Family Medicine

## 2017-01-20 MED ORDER — TRIAMCINOLONE ACETONIDE 0.1 % EX CREA: TOPICAL_CREAM | Freq: Two times a day (BID) | CUTANEOUS | 1 refills | Status: DC

## 2017-02-09 NOTE — Progress Notes (Signed)
   Subjective:    Patient ID: Toni Chan, female    DOB: 03-31-1966, 51 y.o.   MRN: [id number]  HPI 51 y/o female presents for follow up of chronic pain.  Lumbar DDD/Chronic Pain Continues to have low back pain, radiation of pain to bilateral legs (left>right). Taking Norco 10-325 mg BID, Lyrica 75 mg TID, Flexeril 10 mg (taking 2 at night),  Has been taking Norco more over past few days due to cough/viral illness New leg numbness/tingling over the past week, inner thighs down to low legs (More left than right now). Sharp pains that radiate down back leg (only after a lot of walking).   Cervical DDD Improved last visit with taking Flexeril at night, stable, did notice worsening symptoms when stopped Flexeril for a few days  Tobacco Abuse Has been cutting back, down to 1/2 pack per day, working with sister to cut down  Allergies/viral illness  2 days, right ear clogged, rhinnorhea, congestion, taking Flonase and Zyrtec, cough (not able to bring anything up). Not taking any other otc cough/cold medications. Sister recently had viral illness  Social Currently living with sister in [place], has re-established relationship with son, has been walking intermittently, plans to transfer care once PCP Dr. Ree Kida moves to [place] in July 2018.   HM Up to date  Review of Systems See above    Objective:   Physical Exam BP 134/82   Pulse 90   Temp 98.1 F (36.7 C) (Oral)   Ht 5\' 2"  (1.575 m)   Wt 192 lb 3.2 oz (87.2 kg)   SpO2 96%   BMI 35.15 kg/m   Gen: pleasant female, NAD, laryngitis HEENT: normocephalic, PERRL, EOMI, no scleral icterus, MMM, uvula midline, neck supple, no adenopathy Cardiac: RRR, S1 and S2 present, no murmur Resp: diffuse rhonchi and wheezes, normal effort Neuro: CN 2-12 intact, strength bilateral LE 5/5, sensation to light touch intact       Assessment & Plan:  Lumbar degenerative disc disease Symptoms mostly stable however has had some acute  worsening with associated viral illness. No changes in therapy today. Will monitor. Neuro exam unchanged.  -continue Norco, Lyrica, Flexeril, and Cymbalta.   DDD (degenerative disc disease), cervical Stable with Flexeril. -continue current regimen  Viral illness Symptoms consistent with viral illness. However, with smoking history may be having COPD exacerbation. -5 day burst of steroids -Mucinex to decrease congestion -make appointment with pharmacy to have spirometry testing for COPD  Tobacco abuse Continues to smoke 1/2 ppd.  -continue nicotine patch -patient motivated to quit  Encounter for chronic pain management Pain and function stable. -refill of Norco provided (4 month supply) as patient plans to follow Dr. Ree Kida to [place] for care.

## 2017-02-11 ENCOUNTER — Ambulatory Visit (INDEPENDENT_AMBULATORY_CARE_PROVIDER_SITE_OTHER): Payer: Medicare Other | Admitting: Family Medicine

## 2017-02-11 ENCOUNTER — Encounter: Payer: Self-pay | Admitting: Family Medicine

## 2017-02-11 DIAGNOSIS — J309 Allergic rhinitis, unspecified: Secondary | ICD-10-CM | POA: Diagnosis not present

## 2017-02-11 DIAGNOSIS — M5030 Other cervical disc degeneration, unspecified cervical region: Secondary | ICD-10-CM | POA: Diagnosis not present

## 2017-02-11 DIAGNOSIS — G8929 Other chronic pain: Secondary | ICD-10-CM | POA: Diagnosis not present

## 2017-02-11 DIAGNOSIS — M5136 Other intervertebral disc degeneration, lumbar region: Secondary | ICD-10-CM

## 2017-02-11 DIAGNOSIS — B349 Viral infection, unspecified: Secondary | ICD-10-CM | POA: Diagnosis not present

## 2017-02-11 DIAGNOSIS — N809 Endometriosis, unspecified: Secondary | ICD-10-CM | POA: Diagnosis not present

## 2017-02-11 DIAGNOSIS — Z720 Tobacco use: Secondary | ICD-10-CM | POA: Diagnosis not present

## 2017-02-11 MED ORDER — PREDNISONE 50 MG PO TABS: 50.0000 mg | ORAL_TABLET | Freq: Every day | ORAL | 0 refills | Status: DC

## 2017-02-11 MED ORDER — CYCLOBENZAPRINE HCL 10 MG PO TABS: 10.0000 mg | ORAL_TABLET | Freq: Three times a day (TID) | ORAL | 1 refills | Status: DC | PRN

## 2017-02-11 MED ORDER — NICOTINE 21 MG/24HR TD PT24: 21.0000 mg | MEDICATED_PATCH | Freq: Every day | TRANSDERMAL | 0 refills | Status: DC

## 2017-02-11 MED ORDER — HYDROCODONE-ACETAMINOPHEN 10-325 MG PO TABS: 1.0000 | ORAL_TABLET | Freq: Four times a day (QID) | ORAL | 0 refills | Status: DC | PRN

## 2017-02-11 MED ORDER — DULOXETINE HCL 60 MG PO CPEP: 60.0000 mg | ORAL_CAPSULE | Freq: Every day | ORAL | 0 refills | Status: DC

## 2017-02-11 MED ORDER — BUSPIRONE HCL 7.5 MG PO TABS: 7.5000 mg | ORAL_TABLET | Freq: Two times a day (BID) | ORAL | 0 refills | Status: DC

## 2017-02-11 MED ORDER — PREGABALIN 75 MG PO CAPS: 75.0000 mg | ORAL_CAPSULE | Freq: Three times a day (TID) | ORAL | 1 refills | Status: DC

## 2017-02-11 NOTE — Patient Instructions (Addendum)
It was nice to see you today.   Cough/viral illness - you have have the starting of COPD, start Prednisone 50 mg daily for 5 days, start Mucinex (but over the counter).   Pain - refills of Norco, Lyrica, Cymbala provided.   Please cut down on the smoking.   Please make an appointment with Dr. Valentina Lucks (pharmacist) to have a lung function test.   [facility] [address] [phone]

## 2017-02-11 NOTE — Assessment & Plan Note (Signed)
Pain and function stable. -refill of Norco provided (4 month supply) as patient plans to follow Dr. Ree Kida to [place] for care.

## 2017-02-11 NOTE — Assessment & Plan Note (Signed)
Stable with Flexeril. -continue current regimen

## 2017-02-11 NOTE — Assessment & Plan Note (Signed)
Continues to smoke 1/2 ppd.  -continue nicotine patch -patient motivated to quit

## 2017-02-11 NOTE — Assessment & Plan Note (Signed)
Symptoms consistent with viral illness. However, with smoking history may be having COPD exacerbation. -5 day burst of steroids -Mucinex to decrease congestion -make appointment with pharmacy to have spirometry testing for COPD

## 2017-02-11 NOTE — Assessment & Plan Note (Deleted)
Symptoms consistent with viral illness. However, with smoking history may be having COPD exacerbation. -5 day burst of steroids -Mucinex to decrease congestion -make appointment with pharmacy to have spirometry testing for COPD

## 2017-02-11 NOTE — Assessment & Plan Note (Signed)
Symptoms mostly stable however has had some acute worsening with associated viral illness. No changes in therapy today. Will monitor. Neuro exam unchanged.  -continue Norco, Lyrica, Flexeril, and Cymbalta.

## 2017-02-12 ENCOUNTER — Encounter: Payer: Self-pay | Admitting: Family Medicine

## 2017-02-12 ENCOUNTER — Other Ambulatory Visit: Payer: Self-pay | Admitting: Family Medicine

## 2017-02-15 ENCOUNTER — Encounter: Payer: Self-pay | Admitting: Family Medicine

## 2017-02-19 DIAGNOSIS — J329 Chronic sinusitis, unspecified: Secondary | ICD-10-CM | POA: Diagnosis not present

## 2017-02-19 DIAGNOSIS — Z720 Tobacco use: Secondary | ICD-10-CM | POA: Diagnosis not present

## 2017-02-26 ENCOUNTER — Other Ambulatory Visit: Payer: Self-pay | Admitting: Family Medicine

## 2017-02-26 ENCOUNTER — Encounter: Payer: Self-pay | Admitting: Family Medicine

## 2017-03-01 ENCOUNTER — Other Ambulatory Visit: Payer: Self-pay | Admitting: Family Medicine

## 2017-03-01 MED ORDER — IBUPROFEN 800 MG PO TABS: 800.0000 mg | ORAL_TABLET | Freq: Three times a day (TID) | ORAL | 1 refills | Status: AC | PRN

## 2017-03-01 MED ORDER — IBUPROFEN 800 MG PO TABS: 800.0000 mg | ORAL_TABLET | Freq: Three times a day (TID) | ORAL | 1 refills | Status: DC | PRN

## 2017-03-11 ENCOUNTER — Telehealth: Payer: Self-pay | Admitting: Family Medicine
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
## 2017-03-11 NOTE — Telephone Encounter (Signed)
Pt states she needs Dr. Ree Kida to call her today. Says its very very important. ep
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
## 2017-03-23 DIAGNOSIS — M5412 Radiculopathy, cervical region: Secondary | ICD-10-CM | POA: Diagnosis not present

## 2017-03-23 DIAGNOSIS — M5416 Radiculopathy, lumbar region: Secondary | ICD-10-CM | POA: Diagnosis not present

## 2017-03-23 DIAGNOSIS — F331 Major depressive disorder, recurrent, moderate: Secondary | ICD-10-CM | POA: Diagnosis not present

## 2017-03-23 DIAGNOSIS — R32 Unspecified urinary incontinence: Secondary | ICD-10-CM | POA: Diagnosis not present

## 2017-03-23 DIAGNOSIS — F419 Anxiety disorder, unspecified: Secondary | ICD-10-CM | POA: Diagnosis not present

## 2017-03-24 ENCOUNTER — Telehealth: Payer: Self-pay | Admitting: Family Medicine

## 2017-03-24 NOTE — Telephone Encounter (Signed)
Pt's July Rx for hydrocodone says to fill on July 17th. Pt last filled it on June 7th. Pt does not have enough to get her through until the 17th of July. Pt is going out of town this weekend and needs to this before then. Pt would like to know if we could call it into the [facility] [phone], since pt lives 3 hours away. ep

## 2017-03-25 NOTE — Telephone Encounter (Signed)
I have never met this patient, but she was reassigned to me after Dr. Ree Kida left our practice recently. Per his last note, patient is planning to transfer care to his practice in [place]. Chart reviewed and multi-state controlled substance reporting system reviewed. She was given four months of refills written by Dr. Ree Kida during her last visit on 5/17. She had one of these filled on 6/7 at [facility] in [place] - I confirmed this by calling the [facility]. She does not have any hydrocodone prescriptions on file there currently. It appears of the four rx's that Dr. Ree Kida gave patient, two were dated 5/17 and two were dated 7/17.  Please advise patient:  - We cannot call in hydrocodone as it is a controlled substance. - She should have one remaining prescription dated 5/17 that she can take to be filled, but not sooner than 7/7. - I will not authorize a refill earlier than 7/7 as she is presently requesting a fill more than a week early.  Leeanne Rio, MD

## 2017-03-25 NOTE — Telephone Encounter (Signed)
Spoke with patient. She reports she was only given 2 prescriptions when she saw Dr. Ree Kida, one dated 5/17 and one dated 7/17. I actually was able to call and speak with Dr. Ree Kida. He was understandably unsure how many he had physically handed to her as this visit was over a month ago. He does report he believes patient does truly need chronic narcotics.  She reports she typically takes the medication 1 tab twice daily (which should last 2 months with #120 pills) but that lately due to stresses on her body has been taking 3-4 times per day. Will be out on 7/7. She is traveling to [place] for vacation this weekend and will be there when she runs out of medication.  Advised she should take the 7/17 prescription to the pharmacy in [place]. They will be able to call us to verify the rx and I will verbally authorize to them that they can fill it on 7/7. After that, patient will need to be seen here for any further prescriptions. She already has an appointment with me scheduled on 8/1.  Patient appreciative.  Leeanne Rio, MD

## 2017-03-29 NOTE — Telephone Encounter (Signed)
Patient called from [place] saying she was having difficulty getting her rx set up to be filled on 7/7. I called [facility] in [facility] and spoke with the pharmacist and got things straightened out. They will fill the rx on 7/7.  Leeanne Rio, MD

## 2017-04-08 ENCOUNTER — Ambulatory Visit: Payer: Self-pay | Admitting: Pharmacist

## 2017-04-13 DIAGNOSIS — N393 Stress incontinence (female) (male): Secondary | ICD-10-CM | POA: Diagnosis not present

## 2017-04-13 DIAGNOSIS — N319 Neuromuscular dysfunction of bladder, unspecified: Secondary | ICD-10-CM | POA: Diagnosis not present

## 2017-04-13 DIAGNOSIS — R310 Gross hematuria: Secondary | ICD-10-CM | POA: Diagnosis not present

## 2017-04-16 DIAGNOSIS — J329 Chronic sinusitis, unspecified: Secondary | ICD-10-CM | POA: Diagnosis not present

## 2017-04-16 DIAGNOSIS — Z79899 Other long term (current) drug therapy: Secondary | ICD-10-CM | POA: Diagnosis not present

## 2017-04-28 ENCOUNTER — Ambulatory Visit: Payer: Self-pay | Admitting: Family Medicine

## 2017-04-29 ENCOUNTER — Other Ambulatory Visit: Payer: Self-pay | Admitting: Family Medicine
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
## 2017-04-29 NOTE — Telephone Encounter (Addendum)
I am covering for Dr. Ardelia Mems, who is the patient's new PCP.  Her last script was 04/13/2017 for #120 pills.  She shouldn't need any more refills until 1 month later -- which would be mid-August.    It looks like the patient has been receiving Norco 10-325 mg roughly every 3 months -- though I have trouble seeing in the chart whether a script lasts her 3 months or she gets 3 separate scripts for each month.  Under the [place] Controlled Substance Database, she has received Norco in Feb and May in [place].  No other scripts documented.  That includes her most recent script written from 7/17.  This is unusual.  Dr. Ree Kida has a note stating that the patient was planning to follow him to [place] to receive care?  Wonder if she is filling her meds in another state.   Would defer any refills until she meets her PCP or until 1 month from 7/17 prescrioption.

## 2017-05-01 DIAGNOSIS — M546 Pain in thoracic spine: Secondary | ICD-10-CM | POA: Diagnosis not present

## 2017-05-04 NOTE — Telephone Encounter (Signed)
I called patient to discuss refill request. Upon her answering, I introduced myself and said that we do not refill narcotics outside of office visits. Rather than discuss this with me, she replied by stating she was not going to talk with me, mumbled something else that I could not understand, and then she hung up.   Notably I have never met this patient before, she was reassigned to me in June when her PCP Dr. Ree Kida left our practice. I did assist her in getting her narcotic prescription filled out of state last month on 7/7. She had an appointment scheduled with me on August 1 but did not keep this appointment (apparently due to not having gas money).  Reviewed chart. Patient does have a controlled substance agreement on file, on which she signed her initials acknowleging our policy that controlled substances aren't filled outside of office visits.  FYI to Clinic Director Dr. Erin Hearing.  Leeanne Rio, MD
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
## 2017-06-02 DIAGNOSIS — R609 Edema, unspecified: Secondary | ICD-10-CM | POA: Diagnosis not present

## 2017-06-02 DIAGNOSIS — F17200 Nicotine dependence, unspecified, uncomplicated: Secondary | ICD-10-CM | POA: Diagnosis not present

## 2017-06-02 DIAGNOSIS — Z841 Family history of disorders of kidney and ureter: Secondary | ICD-10-CM | POA: Diagnosis not present

## 2017-06-02 DIAGNOSIS — Z79899 Other long term (current) drug therapy: Secondary | ICD-10-CM | POA: Diagnosis not present

## 2017-06-02 DIAGNOSIS — R600 Localized edema: Secondary | ICD-10-CM | POA: Diagnosis not present

## 2017-06-02 DIAGNOSIS — M7989 Other specified soft tissue disorders: Secondary | ICD-10-CM | POA: Diagnosis not present

## 2017-06-08 ENCOUNTER — Telehealth: Payer: Self-pay | Admitting: *Deleted
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
## 2017-06-09 NOTE — Telephone Encounter (Signed)
LM on patient's VM requesting return call to schedule appt for tomorrow 9/13 at 1030 with Dr. Ardelia Mems. Hubbard Hartshorn, RN, BSN

## 2017-06-10 ENCOUNTER — Ambulatory Visit (INDEPENDENT_AMBULATORY_CARE_PROVIDER_SITE_OTHER): Payer: Medicare Other | Admitting: Family Medicine

## 2017-06-10 ENCOUNTER — Ambulatory Visit: Payer: Self-pay

## 2017-06-10 DIAGNOSIS — F329 Major depressive disorder, single episode, unspecified: Secondary | ICD-10-CM

## 2017-06-10 DIAGNOSIS — G8929 Other chronic pain: Secondary | ICD-10-CM | POA: Diagnosis not present

## 2017-06-10 DIAGNOSIS — F32A Depression, unspecified: Secondary | ICD-10-CM

## 2017-06-10 DIAGNOSIS — F419 Anxiety disorder, unspecified: Secondary | ICD-10-CM

## 2017-06-10 MED ORDER — HYDROCODONE-ACETAMINOPHEN 10-325 MG PO TABS: 1.0000 | ORAL_TABLET | Freq: Three times a day (TID) | ORAL | 0 refills | Status: DC | PRN

## 2017-06-10 MED ORDER — HYDROCODONE-ACETAMINOPHEN 10-325 MG PO TABS: 1.0000 | ORAL_TABLET | Freq: Four times a day (QID) | ORAL | 0 refills | Status: DC | PRN

## 2017-06-10 NOTE — Assessment & Plan Note (Signed)
Stable, continue current regimen 
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
## 2017-06-10 NOTE — Telephone Encounter (Signed)
mcintyre agreed to see pt this afternoon. Deseree Kennon Holter, CMA

## 2017-06-10 NOTE — Telephone Encounter (Signed)
Pt said she never received any phone calls rescheduling her appt. The only way she knew she had an appt on Friday was thru My Chart. She said there were no calls on her phone. She has enough pills to last until Saturday.  Please advise

## 2017-06-10 NOTE — Telephone Encounter (Signed)
There is no possible way for me to write her a prescription unless she comes to pick it up. Hydrocodone cannot be called in based on DEA regulations.  I reviewed the multi-state controlled substance database, and she received #120 tablets from a provider Clearnce Hasten) in [place] on 05/06/17. If she intends to keep her care here in [place], I will have to discuss with her in person first.  If patient is able to come for a visit this afternoon I can work her in. Otherwise I cannot prescribe this without seeing her.  Please let patient know.  Thanks, Leeanne Rio, MD
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
## 2017-06-10 NOTE — Assessment & Plan Note (Signed)
UTD on drug screen. Multi state controlled substance database reviewed, last fill was 8/9 for #120 tablets in [place] (had moved up to [place] but had to move back to [place] due to issues within her family). Patient's story is consistent with CSRS.  - refilled medication for 2 months. Patient given two scripts, one for #120 tablets (4 tabs per day total) for this month, and one for #90 tablets (3 tabs per day total) for next month - instructed to begin weaning to 3 tabs per day as she is able, advised against abruptly stopping due to possibility of withdrawal symptoms  - follow up with me in 2 months

## 2017-06-11 ENCOUNTER — Ambulatory Visit: Payer: Self-pay | Admitting: Family Medicine

## 2017-07-12 ENCOUNTER — Encounter: Payer: Self-pay | Admitting: Family Medicine

## 2017-07-22 ENCOUNTER — Other Ambulatory Visit: Payer: Self-pay | Admitting: Family Medicine

## 2017-07-22 NOTE — Telephone Encounter (Signed)
Pt needs appt to refill hydrocodone.  Her Rx ends 08-10-17. Her situation hasnt changed. She is still living out of her car. Please call her to let her know what to do about refills. There are no available appts

## 2017-07-27 DIAGNOSIS — Z23 Encounter for immunization: Secondary | ICD-10-CM | POA: Diagnosis not present

## 2017-07-27 DIAGNOSIS — M5416 Radiculopathy, lumbar region: Secondary | ICD-10-CM | POA: Diagnosis not present

## 2017-08-02 NOTE — Telephone Encounter (Signed)
Red team,   Can you check if patient can come be seen this Thursday afternoon at 3pm? I can work her in outside my normal clinic if she can come then.  Thanks! Leeanne Rio, MD

## 2017-08-04 NOTE — Telephone Encounter (Signed)
Attempted to reach patient. No answer. No voicemail set up for me to leave a message. Red team, can you try to call her again later? Thanks Leeanne Rio, MD

## 2017-08-04 NOTE — Telephone Encounter (Signed)
Attempted to call pt again, no answer. No voicemail setup. Toni Chan Holter, CMA

## 2017-08-06 DIAGNOSIS — F17200 Nicotine dependence, unspecified, uncomplicated: Secondary | ICD-10-CM | POA: Diagnosis not present

## 2017-08-06 DIAGNOSIS — R05 Cough: Secondary | ICD-10-CM | POA: Diagnosis not present

## 2017-08-06 DIAGNOSIS — J189 Pneumonia, unspecified organism: Secondary | ICD-10-CM | POA: Diagnosis not present

## 2017-08-06 DIAGNOSIS — Z79899 Other long term (current) drug therapy: Secondary | ICD-10-CM | POA: Diagnosis not present

## 2017-08-06 DIAGNOSIS — R0602 Shortness of breath: Secondary | ICD-10-CM | POA: Diagnosis not present

## 2017-08-06 DIAGNOSIS — R0981 Nasal congestion: Secondary | ICD-10-CM | POA: Diagnosis not present

## 2017-08-06 DIAGNOSIS — R51 Headache: Secondary | ICD-10-CM | POA: Diagnosis not present

## 2017-08-06 DIAGNOSIS — J9811 Atelectasis: Secondary | ICD-10-CM | POA: Diagnosis not present

## 2017-08-06 DIAGNOSIS — J069 Acute upper respiratory infection, unspecified: Secondary | ICD-10-CM | POA: Diagnosis not present

## 2017-08-10 DIAGNOSIS — R32 Unspecified urinary incontinence: Secondary | ICD-10-CM | POA: Diagnosis not present

## 2017-08-10 DIAGNOSIS — J189 Pneumonia, unspecified organism: Secondary | ICD-10-CM | POA: Diagnosis not present

## 2017-08-12 NOTE — Telephone Encounter (Signed)
Patient never called [place] back. Reviewed multi-state controlled substance database. Patient had rx written on 07/27/17 by a Dr. Clearnce Hasten in [place]. Will not refill this as she has another opioid provider.  Leeanne Rio, MD

## 2017-08-18 ENCOUNTER — Other Ambulatory Visit: Payer: Self-pay | Admitting: Family Medicine

## 2017-08-18 NOTE — Telephone Encounter (Signed)
Pt called and said she is completely out of her Lyrica, Cymbalta, and Buspar. She needs refill sent to [facility]

## 2017-08-18 NOTE — Telephone Encounter (Signed)
Is patient planning to continue coming here for primary care? She recently saw a provider in [place] and had controlled substances provided by them. Please ask her what her intentions are; she should ideally be seeing only one primary care office.  Leeanne Rio, MD

## 2017-08-23 DIAGNOSIS — M5416 Radiculopathy, lumbar region: Secondary | ICD-10-CM | POA: Diagnosis not present

## 2017-08-23 DIAGNOSIS — F419 Anxiety disorder, unspecified: Secondary | ICD-10-CM | POA: Diagnosis not present

## 2017-08-23 DIAGNOSIS — M5412 Radiculopathy, cervical region: Secondary | ICD-10-CM | POA: Diagnosis not present

## 2017-08-23 DIAGNOSIS — R0600 Dyspnea, unspecified: Secondary | ICD-10-CM | POA: Diagnosis not present

## 2017-08-23 DIAGNOSIS — R0689 Other abnormalities of breathing: Secondary | ICD-10-CM | POA: Diagnosis not present

## 2017-08-23 DIAGNOSIS — F331 Major depressive disorder, recurrent, moderate: Secondary | ICD-10-CM | POA: Diagnosis not present

## 2017-08-23 DIAGNOSIS — N3942 Incontinence without sensory awareness: Secondary | ICD-10-CM | POA: Diagnosis not present

## 2017-08-25 NOTE — Telephone Encounter (Signed)
Attempted to call pt, no answer/voicemail, phone just kept ringing. Toni Chan Holter, CMA

## 2017-09-24 ENCOUNTER — Other Ambulatory Visit: Payer: Self-pay | Admitting: Family Medicine

## 2017-10-05 DIAGNOSIS — M5416 Radiculopathy, lumbar region: Secondary | ICD-10-CM | POA: Diagnosis not present

## 2017-10-05 DIAGNOSIS — G8929 Other chronic pain: Secondary | ICD-10-CM | POA: Diagnosis not present

## 2017-10-05 DIAGNOSIS — Z6831 Body mass index (BMI) 31.0-31.9, adult: Secondary | ICD-10-CM | POA: Diagnosis not present

## 2017-10-05 DIAGNOSIS — Z79899 Other long term (current) drug therapy: Secondary | ICD-10-CM | POA: Diagnosis not present

## 2017-10-05 DIAGNOSIS — E119 Type 2 diabetes mellitus without complications: Secondary | ICD-10-CM | POA: Diagnosis not present

## 2017-10-05 DIAGNOSIS — M961 Postlaminectomy syndrome, not elsewhere classified: Secondary | ICD-10-CM | POA: Diagnosis not present

## 2017-10-06 DIAGNOSIS — M5416 Radiculopathy, lumbar region: Secondary | ICD-10-CM | POA: Diagnosis not present

## 2017-10-06 DIAGNOSIS — Z8701 Personal history of pneumonia (recurrent): Secondary | ICD-10-CM | POA: Diagnosis not present

## 2017-10-06 DIAGNOSIS — F17200 Nicotine dependence, unspecified, uncomplicated: Secondary | ICD-10-CM | POA: Diagnosis not present

## 2017-10-06 DIAGNOSIS — E7800 Pure hypercholesterolemia, unspecified: Secondary | ICD-10-CM | POA: Diagnosis not present

## 2017-10-06 DIAGNOSIS — J189 Pneumonia, unspecified organism: Secondary | ICD-10-CM | POA: Diagnosis not present

## 2017-10-06 DIAGNOSIS — N3946 Mixed incontinence: Secondary | ICD-10-CM | POA: Diagnosis not present

## 2017-10-06 DIAGNOSIS — E781 Pure hyperglyceridemia: Secondary | ICD-10-CM | POA: Diagnosis not present

## 2017-10-06 DIAGNOSIS — R918 Other nonspecific abnormal finding of lung field: Secondary | ICD-10-CM | POA: Diagnosis not present

## 2017-10-20 DIAGNOSIS — G894 Chronic pain syndrome: Secondary | ICD-10-CM | POA: Diagnosis not present

## 2017-10-20 DIAGNOSIS — G629 Polyneuropathy, unspecified: Secondary | ICD-10-CM | POA: Diagnosis not present

## 2017-10-20 DIAGNOSIS — K219 Gastro-esophageal reflux disease without esophagitis: Secondary | ICD-10-CM | POA: Diagnosis not present

## 2017-10-20 DIAGNOSIS — Z79891 Long term (current) use of opiate analgesic: Secondary | ICD-10-CM | POA: Diagnosis not present

## 2017-10-20 DIAGNOSIS — F419 Anxiety disorder, unspecified: Secondary | ICD-10-CM | POA: Diagnosis not present

## 2017-11-11 DIAGNOSIS — F329 Major depressive disorder, single episode, unspecified: Secondary | ICD-10-CM | POA: Diagnosis not present

## 2017-11-11 DIAGNOSIS — F419 Anxiety disorder, unspecified: Secondary | ICD-10-CM | POA: Diagnosis not present

## 2017-11-11 DIAGNOSIS — M5441 Lumbago with sciatica, right side: Secondary | ICD-10-CM | POA: Diagnosis not present

## 2017-11-11 DIAGNOSIS — Z79899 Other long term (current) drug therapy: Secondary | ICD-10-CM | POA: Diagnosis not present

## 2017-11-11 DIAGNOSIS — F17200 Nicotine dependence, unspecified, uncomplicated: Secondary | ICD-10-CM | POA: Diagnosis not present

## 2017-11-11 DIAGNOSIS — Z841 Family history of disorders of kidney and ureter: Secondary | ICD-10-CM | POA: Diagnosis not present

## 2017-11-18 DIAGNOSIS — M545 Low back pain: Secondary | ICD-10-CM | POA: Diagnosis not present

## 2017-11-18 DIAGNOSIS — M47816 Spondylosis without myelopathy or radiculopathy, lumbar region: Secondary | ICD-10-CM | POA: Diagnosis not present

## 2017-12-03 DIAGNOSIS — E781 Pure hyperglyceridemia: Secondary | ICD-10-CM | POA: Diagnosis not present

## 2017-12-03 DIAGNOSIS — R9389 Abnormal findings on diagnostic imaging of other specified body structures: Secondary | ICD-10-CM | POA: Diagnosis not present

## 2017-12-03 DIAGNOSIS — E7800 Pure hypercholesterolemia, unspecified: Secondary | ICD-10-CM | POA: Diagnosis not present

## 2017-12-03 DIAGNOSIS — F17210 Nicotine dependence, cigarettes, uncomplicated: Secondary | ICD-10-CM | POA: Diagnosis not present

## 2017-12-03 DIAGNOSIS — M5416 Radiculopathy, lumbar region: Secondary | ICD-10-CM | POA: Diagnosis not present

## 2017-12-29 DIAGNOSIS — R51 Headache: Secondary | ICD-10-CM | POA: Diagnosis not present

## 2017-12-29 DIAGNOSIS — R252 Cramp and spasm: Secondary | ICD-10-CM | POA: Diagnosis not present

## 2017-12-29 DIAGNOSIS — R112 Nausea with vomiting, unspecified: Secondary | ICD-10-CM | POA: Diagnosis not present

## 2017-12-29 DIAGNOSIS — R42 Dizziness and giddiness: Secondary | ICD-10-CM | POA: Diagnosis not present

## 2018-01-05 DIAGNOSIS — M545 Low back pain: Secondary | ICD-10-CM | POA: Diagnosis not present

## 2018-01-05 DIAGNOSIS — M5416 Radiculopathy, lumbar region: Secondary | ICD-10-CM | POA: Diagnosis not present

## 2018-01-06 DIAGNOSIS — R9389 Abnormal findings on diagnostic imaging of other specified body structures: Secondary | ICD-10-CM | POA: Diagnosis not present

## 2018-01-06 DIAGNOSIS — E7800 Pure hypercholesterolemia, unspecified: Secondary | ICD-10-CM | POA: Diagnosis not present

## 2018-01-06 DIAGNOSIS — E781 Pure hyperglyceridemia: Secondary | ICD-10-CM | POA: Diagnosis not present

## 2018-01-06 DIAGNOSIS — J45909 Unspecified asthma, uncomplicated: Secondary | ICD-10-CM | POA: Diagnosis not present

## 2018-01-06 DIAGNOSIS — M549 Dorsalgia, unspecified: Secondary | ICD-10-CM | POA: Diagnosis not present

## 2018-01-06 DIAGNOSIS — F17210 Nicotine dependence, cigarettes, uncomplicated: Secondary | ICD-10-CM | POA: Diagnosis not present

## 2018-01-06 DIAGNOSIS — G8929 Other chronic pain: Secondary | ICD-10-CM | POA: Diagnosis not present

## 2019-08-18 ENCOUNTER — Ambulatory Visit: Payer: Medicare Other | Admitting: Orthopaedic Surgery

## 2019-09-19 ENCOUNTER — Ambulatory Visit: Payer: Medicare Other | Admitting: Orthopaedic Surgery

## 2019-10-06 ENCOUNTER — Ambulatory Visit: Payer: Medicare Other | Admitting: Orthopaedic Surgery

## 2021-09-01 ENCOUNTER — Other Ambulatory Visit: Payer: Self-pay | Admitting: Orthopaedic Surgery

## 2021-09-02 ENCOUNTER — Other Ambulatory Visit: Payer: Self-pay

## 2021-09-02 ENCOUNTER — Encounter: Payer: Self-pay | Admitting: Orthopaedic Surgery

## 2021-09-02 ENCOUNTER — Ambulatory Visit (INDEPENDENT_AMBULATORY_CARE_PROVIDER_SITE_OTHER): Payer: Medicare Other | Admitting: Orthopaedic Surgery

## 2021-09-02 VITALS — BP 151/86 | Ht 63.0 in | Wt 160.0 lb

## 2021-09-02 DIAGNOSIS — M5030 Other cervical disc degeneration, unspecified cervical region: Secondary | ICD-10-CM

## 2021-09-02 MED ORDER — PREDNISONE 10 MG (21) PO TBPK: ORAL_TABLET | ORAL | 0 refills | Status: AC

## 2021-09-02 NOTE — Progress Notes (Signed)
Office Visit Note   Patient: Toni Chan           Date of Birth: 26-Nov-1965           MRN: [id number] Visit Date: 09/02/2021              Requested by: No referring provider defined for this encounter. PCP: No primary care provider on file.   Assessment & Plan: Visit Diagnoses:  1. DDD (degenerative disc disease), cervical     Plan: Patient gets good relief with distraction we will set up for some home cervical traction that she can use.  Continue ibuprofen and Tylenol recheck 6 weeks.  Reviewed on disc her MRI scan which shows some disc bulging biforaminal narrowing at C5-6.  Recheck 6 weeks.  Images were reviewed on the disc.  Pathophysiology discussed.  Use of  appropriate home cervical traction discussed.  Follow-Up Instructions: Return in about 6 weeks (around 10/14/2021).   Orders:  No orders of the defined types were placed in this encounter.  Meds ordered this encounter  Medications   predniSONE (STERAPRED UNI-PAK 21 TAB) 10 MG (21) TBPK tablet    Sig: Take 6,5,4,3,2,1 one less tablet daily with food    Dispense:  21 tablet    Refill:  0      Procedures: No procedures performed   Clinical Data: No additional findings.   Subjective: Chief Complaint  Patient presents with   Neck - Pain    HPI 55 year old female seen with problems with neck pain associated with posterior headaches.  Pain radiates into her left shoulder.  She is also had some pain that radiates down between the shoulder blades.  Patient states she had an episode where she passed out fell hit her head.  She was seen at the orthopedist in [place].  MRI scan done at [facility] on 07/16/2021 showed normal-appearing cervical cord multilevel degenerative changes with mild bulging at C3-4, C4-5.  Moderate biforaminal narrowing and bulging disc at C5-6 and mild bulge at C6-7. Patient returns see me concerning continued problems that she has had.  Previous lumbar TLIF 2016 continues  to do well.  Include with patient's our office notes from her PCP about treatment for disc degeneration at C5-6. Review of Systems previous lumbar fusion.  Posterior cervical spondylosis anxiety depression under treatment.  Negative for chills or fever.  No associated bowel or bladder symptoms.   Objective: Vital Signs: BP (!) 151/86   Ht 5\' 3"  (1.6 m)   Wt 160 lb (72.6 kg)   BMI 28.34 kg/m   Physical Exam Constitutional:      Appearance: She is well-developed.  HENT:     Head: Normocephalic.     Right Ear: External ear normal.     Left Ear: External ear normal. There is no impacted cerumen.  Eyes:     Pupils: Pupils are equal, round, and reactive to light.  Neck:     Thyroid: No thyromegaly.     Trachea: No tracheal deviation.  Cardiovascular:     Rate and Rhythm: Normal rate.  Pulmonary:     Effort: Pulmonary effort is normal.  Abdominal:     Palpations: Abdomen is soft.  Musculoskeletal:     Cervical back: No rigidity.  Skin:    General: Skin is warm and dry.  Neurological:     Mental Status: She is alert and oriented to person, place, and time.  Psychiatric:        Behavior:  Behavior normal.    Ortho Exam patient has a brachial plexus tenderness right left increased pain with cervical compression she gets some relief with distraction.  Upper extremity reflexes are 2+ and symmetrical no lower extremity hyperreflexia.  No clonus.  Normal heel toe gait.  For percent cervical rotation right and left.  No atrophy of the upper extremities appreciatively pulses are 2+.  Specialty Comments:  No specialty comments available.  Imaging: No results found.   PMFS History: Patient Active Problem List   Diagnosis Date Noted   Palpitations 11/21/2016   Dyslipidemia 11/21/2016   Atypical chest pain 10/01/2016   Screening for HIV (human immunodeficiency virus) 09/30/2016   Cervicalgia 08/27/2016   Leaking of urine 04/17/2016   Chronic abdominal pain 12/27/2015   Tobacco  abuse 12/27/2015   Right shoulder pain 09/06/2015   Allergic rhinitis 08/08/2015   Constipation 07/19/2015   Social anxiety disorder 07/19/2015   DDD (degenerative disc disease), cervical 04/23/2015   S/P lumbar fusion 02/27/2015   Encounter for chronic pain management 01/02/2015   Lumbar degenerative disc disease 12/03/2014   Preventative health care 12/03/2014   Endometriosis 12/03/2014   GERD (gastroesophageal reflux disease) 12/03/2014   Anxiety and depression 12/03/2014   Past Medical History:  Diagnosis Date   Abdominal pain, left upper quadrant 07/05/2015   Anxiety    Arthritis    Cervical cancer ([place]) 1989   Vaginal hysterectomy and oophorectomy 1993   Chronic back pain    Chronic neck pain    Depression    Dyslipidemia    Endometriosis    GERD (gastroesophageal reflux disease)    Hx MRSA infection 2014   in the area of the scar fr. having coccyx removal   IBS (irritable bowel syndrome)    Kidney stones 2014   Pilonidal cyst 2015   Placental abruption    Pneumonia    Rash and nonspecific skin eruption 06/13/2015   Skin Biopsy 02/2016 Skin , left lower calf FAVOR PSORIASIS, EVOLVING LESION ON STASIS ALTERED SKIN    Renal cyst 2014   Superficial thrombophlebitis     Family History  Problem Relation Age of Onset   Depression Mother    Alcohol abuse Father    Heart disease Father    Kidney disease Father    Heart attack Father 28   Alcohol abuse Brother    Heart disease Brother    Drug abuse Brother    Peripheral Artery Disease Brother     Past Surgical History:  Procedure Laterality Date   ABDOMINAL HYSTERECTOMY     laparoscopic   BACK SURGERY  2013, 2014 [place]   COCCYX REMOVAL  2014   COLONOSCOPY     LUMBAR FUSION  02/27/2015   L4     TONSILLECTOMY     Social History   Occupational History   Not on file  Tobacco Use   Smoking status: Every Day    Packs/day: 0.50    Years: 3.00    Pack years: 1.50    Types: Cigarettes    Smokeless tobacco: Never  Substance and Sexual Activity   Alcohol use: No    Alcohol/week: 0.0 standard drinks   Drug use: No   Sexual activity: Never

## 2021-10-14 ENCOUNTER — Ambulatory Visit: Payer: Medicare Other | Admitting: Orthopaedic Surgery
